# Patient Record
Sex: Female | Born: 1975 | Race: White | Hispanic: No | Marital: Married | Smoking: Former smoker
Health system: Southern US, Community
[De-identification: ages and names within clinical notes are randomized; demographics above are authoritative.]

## PROBLEM LIST (undated history)

## (undated) DIAGNOSIS — N200 Calculus of kidney: Secondary | ICD-10-CM

## (undated) DIAGNOSIS — R7303 Prediabetes: Secondary | ICD-10-CM

## (undated) DIAGNOSIS — N6012 Diffuse cystic mastopathy of left breast: Secondary | ICD-10-CM

## (undated) DIAGNOSIS — Z87442 Personal history of urinary calculi: Secondary | ICD-10-CM

## (undated) HISTORY — DX: Calculus of kidney: N20.0

## (undated) HISTORY — DX: Diffuse cystic mastopathy of left breast: N60.12

---

## 1999-09-24 ENCOUNTER — Emergency Department (HOSPITAL_COMMUNITY): Admission: EM | Admit: 1999-09-24 | Discharge: 1999-09-24 | Payer: Self-pay | Admitting: Emergency Medicine

## 1999-09-25 ENCOUNTER — Encounter: Payer: Self-pay | Admitting: Emergency Medicine

## 2013-04-18 ENCOUNTER — Emergency Department: Payer: Self-pay | Admitting: Emergency Medicine

## 2015-05-19 ENCOUNTER — Emergency Department
Admission: EM | Admit: 2015-05-19 | Discharge: 2015-05-19 | Disposition: A | Discharge status: Home / Self Care | Payer: Managed Care, Other (non HMO) | Attending: Emergency Medicine | Admitting: Emergency Medicine

## 2015-05-19 ENCOUNTER — Emergency Department: Payer: Managed Care, Other (non HMO)

## 2015-05-19 ENCOUNTER — Encounter: Payer: Self-pay | Admitting: Emergency Medicine

## 2015-05-19 DIAGNOSIS — Z3202 Encounter for pregnancy test, result negative: Secondary | ICD-10-CM | POA: Diagnosis not present

## 2015-05-19 DIAGNOSIS — R109 Unspecified abdominal pain: Secondary | ICD-10-CM

## 2015-05-19 DIAGNOSIS — R1032 Left lower quadrant pain: Secondary | ICD-10-CM | POA: Diagnosis present

## 2015-05-19 DIAGNOSIS — N200 Calculus of kidney: Secondary | ICD-10-CM | POA: Diagnosis not present

## 2015-05-19 DIAGNOSIS — Z87891 Personal history of nicotine dependence: Secondary | ICD-10-CM | POA: Diagnosis not present

## 2015-05-19 LAB — POCT PREGNANCY, URINE: PREG TEST UR: NEGATIVE

## 2015-05-19 LAB — URINALYSIS COMPLETE WITH MICROSCOPIC (ARMC ONLY)
BILIRUBIN URINE: NEGATIVE
GLUCOSE, UA: NEGATIVE mg/dL
Nitrite: NEGATIVE
PH: 5 (ref 5.0–8.0)
Protein, ur: NEGATIVE mg/dL
Specific Gravity, Urine: 1.025 (ref 1.005–1.030)

## 2015-05-19 LAB — COMPREHENSIVE METABOLIC PANEL
ALBUMIN: 4.2 g/dL (ref 3.5–5.0)
ALK PHOS: 75 U/L (ref 38–126)
ALT: 19 U/L (ref 14–54)
AST: 24 U/L (ref 15–41)
Anion gap: 10 (ref 5–15)
BUN: 12 mg/dL (ref 6–20)
CALCIUM: 9.2 mg/dL (ref 8.9–10.3)
CO2: 21 mmol/L — AB (ref 22–32)
CREATININE: 0.82 mg/dL (ref 0.44–1.00)
Chloride: 107 mmol/L (ref 101–111)
GFR calc non Af Amer: >60 mL/min (ref >60)
GLUCOSE: 145 mg/dL — AB (ref 65–99)
Potassium: 4.4 mmol/L (ref 3.5–5.1)
SODIUM: 138 mmol/L (ref 135–145)
Total Bilirubin: 0.7 mg/dL (ref 0.3–1.2)
Total Protein: 7.5 g/dL (ref 6.5–8.1)

## 2015-05-19 LAB — CBC
HCT: 43.6 % (ref 35.0–47.0)
Hemoglobin: 14.5 g/dL (ref 12.0–16.0)
MCH: 30.1 pg (ref 26.0–34.0)
MCHC: 33.2 g/dL (ref 32.0–36.0)
MCV: 90.5 fL (ref 80.0–100.0)
PLATELETS: 315 10*3/uL (ref 150–440)
RBC: 4.82 MIL/uL (ref 3.80–5.20)
RDW: 13.5 % (ref 11.5–14.5)
WBC: 13.9 10*3/uL — ABNORMAL HIGH (ref 3.6–11.0)

## 2015-05-19 LAB — LIPASE, BLOOD: Lipase: 21 U/L (ref 11–51)

## 2015-05-19 MED ORDER — MORPHINE SULFATE (PF) 4 MG/ML IV SOLN
4.0000 mg | Freq: Once | INTRAVENOUS | Status: AC
  Administered 2015-05-19: 4 mg via INTRAVENOUS

## 2015-05-19 MED ORDER — ONDANSETRON HCL 4 MG/2ML IJ SOLN
4.0000 mg | Freq: Once | INTRAMUSCULAR | Status: AC
  Administered 2015-05-19: 4 mg via INTRAVENOUS

## 2015-05-19 MED ORDER — ONDANSETRON HCL 4 MG/2ML IJ SOLN
INTRAMUSCULAR | Status: AC
  Filled 2015-05-19: qty 2

## 2015-05-19 MED ORDER — ONDANSETRON HCL 4 MG PO TABS: 4.0000 mg | ORAL_TABLET | Freq: Three times a day (TID) | ORAL | Status: DC | PRN

## 2015-05-19 MED ORDER — FENTANYL CITRATE (PF) 100 MCG/2ML IJ SOLN
50.0000 ug | Freq: Once | INTRAMUSCULAR | Status: AC
  Administered 2015-05-19: 50 ug via INTRAVENOUS

## 2015-05-19 MED ORDER — KETOROLAC TROMETHAMINE 30 MG/ML IJ SOLN
30.0000 mg | Freq: Once | INTRAMUSCULAR | Status: AC
  Administered 2015-05-19: 30 mg via INTRAVENOUS
  Filled 2015-05-19: qty 1

## 2015-05-19 MED ORDER — ONDANSETRON HCL 4 MG/2ML IJ SOLN
INTRAMUSCULAR | Status: AC
  Administered 2015-05-19: 4 mg via INTRAVENOUS
  Filled 2015-05-19: qty 2

## 2015-05-19 MED ORDER — TAMSULOSIN HCL 0.4 MG PO CAPS
0.4000 mg | ORAL_CAPSULE | Freq: Once | ORAL | Status: AC
  Administered 2015-05-19: 0.4 mg via ORAL
  Filled 2015-05-19: qty 1

## 2015-05-19 MED ORDER — MORPHINE SULFATE (PF) 4 MG/ML IV SOLN
INTRAVENOUS | Status: AC
  Filled 2015-05-19: qty 1

## 2015-05-19 MED ORDER — FENTANYL CITRATE (PF) 100 MCG/2ML IJ SOLN
INTRAMUSCULAR | Status: AC
  Administered 2015-05-19: 50 ug via INTRAVENOUS
  Filled 2015-05-19: qty 2

## 2015-05-19 MED ORDER — KETOROLAC TROMETHAMINE 10 MG PO TABS: 10.0000 mg | ORAL_TABLET | Freq: Three times a day (TID) | ORAL | Status: DC | PRN

## 2015-05-19 MED ORDER — TAMSULOSIN HCL 0.4 MG PO CAPS: 0.4000 mg | ORAL_CAPSULE | Freq: Every day | ORAL | Status: DC

## 2015-05-19 NOTE — ED Provider Notes (Signed)
[facility] Emergency Department Provider Note    ____________________________________________  Time seen: 7  I have reviewed the triage vital signs and the nursing notes.   HISTORY  Chief Complaint Abdominal Pain   History limited by: Not Limited   HPI Kristi Ibarra is a 40 y.o. female who presents to the emergency department today because of concerns for abdominal pain. She states that it started roughly 3 hours ago. It is located in the left lower quadrant. It does radiate to her back. She describes it as sharp and severe. It does wax and wane. She has not been able to identify any modifying factors. She has never had pain like this before. She has not noticed any recent change to urination or defecation. She denies any fevers.     History reviewed. No pertinent past medical history.  There are no active problems to display for this patient.   History reviewed. No pertinent past surgical history.   Allergies Erythromycin  History reviewed. No pertinent family history.  Social History Social History  Substance Use Topics  . Smoking status: Former Games developer  . Smokeless tobacco: None  . Alcohol Use: No    Review of Systems  Constitutional: Negative for fever. Cardiovascular: Negative for chest pain. Respiratory: Negative for shortness of breath. Gastrointestinal: Negative for abdominal pain, vomiting and diarrhea. Neurological: Negative for headaches, focal weakness or numbness.  10-point ROS otherwise negative.  ____________________________________________   PHYSICAL EXAM:  VITAL SIGNS: ED Triage Vitals  Enc Vitals Group     BP 05/19/15 0107 108/79 mmHg     Pulse Rate 05/19/15 0107 85     Resp --      Temp 05/19/15 0109 97.9 F (36.6 C)     Temp Source 05/19/15 0109 Oral     SpO2 05/19/15 0107 98 %     Weight --      Height --      Head Cir --      Peak Flow --      Pain Score 05/19/15 0120 10   Constitutional: Alert  and oriented. Appears in moderate distress  Eyes: Conjunctivae are normal. PERRL. Normal extraocular movements. ENT   Head: Normocephalic and atraumatic.   Nose: No congestion/rhinnorhea.   Mouth/Throat: Mucous membranes are moist.   Neck: No stridor. Hematological/Lymphatic/Immunilogical: No cervical lymphadenopathy. Cardiovascular: Normal rate, regular rhythm.  No murmurs, rubs, or gallops. Respiratory: Normal respiratory effort without tachypnea nor retractions. Breath sounds are clear and equal bilaterally. No wheezes/rales/rhonchi. Gastrointestinal: Soft and minimally tender to palpation in the left lower quadrant. Genitourinary: Deferred Musculoskeletal: Normal range of motion in all extremities. No joint effusions.  No lower extremity tenderness nor edema. Neurologic:  Normal speech and language. No gross focal neurologic deficits are appreciated.  Skin:  Skin is warm, dry and intact. No rash noted. Psychiatric: Mood and affect are normal. Speech and behavior are normal. Patient exhibits appropriate insight and judgment.  ____________________________________________    LABS (pertinent positives/negatives)  Labs Reviewed  COMPREHENSIVE METABOLIC PANEL - Abnormal; Notable for the following:    CO2 21 (*)    Glucose, Bld 145 (*)    All other components within normal limits  CBC - Abnormal; Notable for the following:    WBC 13.9 (*)    All other components within normal limits  URINALYSIS COMPLETEWITH MICROSCOPIC ([facility] ONLY) - Abnormal; Notable for the following:    Color, Urine YELLOW (*)    APPearance HAZY (*)    Ketones, ur 1+ (*)  Hgb urine dipstick 2+ (*)    Leukocytes, UA TRACE (*)    Bacteria, UA RARE (*)    Squamous Epithelial / LPF 6-30 (*)    All other components within normal limits  LIPASE, BLOOD  POC URINE PREG, ED  POCT PREGNANCY, URINE      ____________________________________________   EKG  None  ____________________________________________    RADIOLOGY  CT renal IMPRESSION: A 3 mm left UVJ stone with mild left hydronephrosis.  Small nonobstructing left renal interpolar calculi.  Colonic diverticulosis. No evidence of bowel obstruction or inflammation. Normal appendix.   ____________________________________________   PROCEDURES  Procedure(s) performed: None  Critical Care performed: No  ____________________________________________   INITIAL IMPRESSION / ASSESSMENT AND PLAN / ED COURSE  Pertinent labs & imaging results that were available during my care of the patient were reviewed by me and considered in my medical decision making (see chart for details).  Patient presented to the emergency department today because of concerns for left flank pain. History is concerning for a kidney stone. A renal stone CT was obtained which did show a 3 mm left UVJ stone. Patient felt much that her after Toradol. Will discharge home with medications and urology follow-up.  ____________________________________________   FINAL CLINICAL IMPRESSION(S) / ED DIAGNOSES  Final diagnoses:  Left flank pain  Kidney stone     Phineas Semen, MD 05/19/15 1610

## 2015-05-19 NOTE — ED Notes (Signed)
Patient reports left lower quad abdominal pain that radiates towards left lower back.

## 2015-05-19 NOTE — Discharge Instructions (Signed)
Please seek medical attention for any high fevers, chest pain, shortness of breath, change in behavior, persistent vomiting, bloody stool or any other new or concerning symptoms. ° ° °Kidney Stones °Kidney stones (urolithiasis) are deposits that form inside your kidneys. The intense pain is caused by the stone moving through the urinary tract. When the stone moves, the ureter goes into spasm around the stone. The stone is usually passed in the urine.  °CAUSES  °· A disorder that makes certain neck glands produce too much parathyroid hormone (primary hyperparathyroidism). °· A buildup of uric acid crystals, similar to gout in your joints. °· Narrowing (stricture) of the ureter. °· A kidney obstruction present at birth (congenital obstruction). °· Previous surgery on the kidney or ureters. °· Numerous kidney infections. °SYMPTOMS  °· Feeling sick to your stomach (nauseous). °· Throwing up (vomiting). °· Blood in the urine (hematuria). °· Pain that usually spreads (radiates) to the groin. °· Frequency or urgency of urination. °DIAGNOSIS  °· Taking a history and physical exam. °· Blood or urine tests. °· CT scan. °· Occasionally, an examination of the inside of the urinary bladder (cystoscopy) is performed. °TREATMENT  °· Observation. °· Increasing your fluid intake. °· Extracorporeal shock wave lithotripsy--This is a noninvasive procedure that uses shock waves to break up kidney stones. °· Surgery may be needed if you have severe pain or persistent obstruction. There are various surgical procedures. Most of the procedures are performed with the use of small instruments. Only small incisions are needed to accommodate these instruments, so recovery time is minimized. °The size, location, and chemical composition are all important variables that will determine the proper choice of action for you. Talk to your health care provider to better understand your situation so that you will minimize the risk of injury to yourself  and your kidney.  °HOME CARE INSTRUCTIONS  °· Drink enough water and fluids to keep your urine clear or pale yellow. This will help you to pass the stone or stone fragments. °· Strain all urine through the provided strainer. Keep all particulate matter and stones for your health care provider to see. The stone causing the pain may be as small as a grain of salt. It is very important to use the strainer each and every time you pass your urine. The collection of your stone will allow your health care provider to analyze it and verify that a stone has actually passed. The stone analysis will often identify what you can do to reduce the incidence of recurrences. °· Only take over-the-counter or prescription medicines for pain, discomfort, or fever as directed by your health care provider. °· Keep all follow-up visits as told by your health care provider. This is important. °· Get follow-up X-rays if required. The absence of pain does not always mean that the stone has passed. It may have only stopped moving. If the urine remains completely obstructed, it can cause loss of kidney function or even complete destruction of the kidney. It is your responsibility to make sure X-rays and follow-ups are completed. Ultrasounds of the kidney can show blockages and the status of the kidney. Ultrasounds are not associated with any radiation and can be performed easily in a matter of minutes. °· Make changes to your daily diet as told by your health care provider. You may be told to: °¨ Limit the amount of salt that you eat. °¨ Eat 5 or more servings of fruits and vegetables each day. °¨ Limit the amount of meat,   poultry, fish, and eggs that you eat. °· Collect a 24-hour urine sample as told by your health care provider. You may need to collect another urine sample every 6-12 months. °SEEK MEDICAL CARE IF: °· You experience pain that is progressive and unresponsive to any pain medicine you have been prescribed. °SEEK IMMEDIATE  MEDICAL CARE IF:  °· Pain cannot be controlled with the prescribed medicine. °· You have a fever or shaking chills. °· The severity or intensity of pain increases over 18 hours and is not relieved by pain medicine. °· You develop a new onset of abdominal pain. °· You feel faint or pass out. °· You are unable to urinate. °  °This information is not intended to replace advice given to you by your health care provider. Make sure you discuss any questions you have with your health care provider. °  °Document Released: 04/13/2005 Document Revised: 01/02/2015 Document Reviewed: 09/14/2012 °Elsevier Interactive Patient Education ©2016 [employer] ° °

## 2016-01-30 ENCOUNTER — Encounter: Payer: Self-pay | Admitting: Obstetrics and Gynecology

## 2016-01-30 ENCOUNTER — Ambulatory Visit (INDEPENDENT_AMBULATORY_CARE_PROVIDER_SITE_OTHER): Payer: Managed Care, Other (non HMO) | Admitting: Obstetrics and Gynecology

## 2016-01-30 VITALS — BP 112/78 | HR 82 | Ht 63.5 in | Wt 173.8 lb

## 2016-01-30 DIAGNOSIS — N938 Other specified abnormal uterine and vaginal bleeding: Secondary | ICD-10-CM

## 2016-01-30 DIAGNOSIS — N951 Menopausal and female climacteric states: Secondary | ICD-10-CM | POA: Diagnosis not present

## 2016-01-30 DIAGNOSIS — Z124 Encounter for screening for malignant neoplasm of cervix: Secondary | ICD-10-CM

## 2016-01-30 NOTE — Progress Notes (Signed)
GYNECOLOGY CLINIC PROGRESS NOTE  Subjective:     Kristi Ibarra is an 40 y.o. G0P0  woman who presents for irregular menses. She had been bleeding regularly up until 4 months ago.  In June patient notes hr menstrual cycle lasted only 3 days.  She skipped menses in July, and then in August her cycle lasted 24 days (with onset 12/23/15).  Notes that she had 3-4 days of resolution, and then menses started again on September 27th, and is currently still ongoing.  She changes her pad or tampon every 2-3 hours. Clots are small. Dysmenorrhea:noted as mostly upper back pain during first 2-3 days of cycle. Current contraception: none. History of infertility: yes (patient notes she has not used contraception in many years but was never able to conceive). History of abnormal Pap smear: no.  Patient has not had a pap smear or gynecologic visit in ~ 6-7 years.  Menstrual History: OB History    Gravida Para Term Preterm AB Living   0             SAB TAB Ectopic Multiple Live Births                  Menarche age: 50 Patient's last menstrual period was 10/09/2015 (exact date).     History reviewed. No pertinent past medical history.  Family History  Problem Relation Age of Onset  . Asthma Mother     History reviewed. No pertinent surgical history.  Social History   Social History  . Marital status: Married    Spouse name: N/A  . Number of children: N/A  . Years of education: N/A   Occupational History  . Not on file.   Social History Main Topics  . Smoking status: Current Every Day Smoker  . Smokeless tobacco: Never Used  . Alcohol use No  . Drug use: No  . Sexual activity: Yes    Partners: Male   Other Topics Concern  . Not on file   Social History Narrative  . No narrative on file    Current Outpatient Prescriptions on File Prior to Visit  Medication Sig Dispense Refill  . ketorolac (TORADOL) 10 MG tablet Take 1 tablet (10 mg total) by mouth every 8 (eight) hours as needed  for severe pain. 20 tablet 0  . ondansetron (ZOFRAN) 4 MG tablet Take 1 tablet (4 mg total) by mouth every 8 (eight) hours as needed for nausea or vomiting. 20 tablet 0  . tamsulosin (FLOMAX) 0.4 MG CAPS capsule Take 1 capsule (0.4 mg total) by mouth daily. 14 capsule 0   No current facility-administered medications on file prior to visit.     Allergies  Allergen Reactions  . Erythromycin Rash     Review of Systems A comprehensive review of systems was negative except for: Genitourinary: positive for abnormal menstrual periods and hot flashes    Objective:    BP 112/78   Pulse 82   Ht 5' 3.5" (1.613 m)   Wt 173 lb 12.8 oz (78.8 kg)   LMP 10/09/2015 (Exact Date) Comment: irregular menses  BMI 30.30 kg/m   General:   alert and no distress  Skin:    normal and no rash or abnormalities  Neck:  no adenopathy, no carotid bruit, no JVD, supple, symmetrical, trachea midline and thyroid not enlarged, symmetric, no tenderness/mass/nodules  Heart:   regular rate and rhythm, S1, S2 normal, no murmur, click, rub or gallop   Lungs:   clear  to auscultation bilaterally  Abdomen:  soft, non-tender; bowel sounds normal; no masses,  no organomegaly  Pelvic:   external genitalia normal, rectovaginal septum normal.  Vagina without discharge.  Cervix normal appearing, no lesions and no motion tenderness.  Uterus mobile, nontender, normal shape and size.  Adnexae non-palpable, nontender bilaterally.   Extremities:   extremities normal, atraumatic, no cyanosis or edema      Assessment:    dysfunctional uterine bleeding   Hot flushes Cervical cancer screening  Plan:   - Patient has abnormal uterine bleeding . She has a normal exam, no evidence of lesions.  - Blood tests: CBC with diff, Estradiol, FSH, LH, Progesterone level and TSH. Will assess for anemia due to bleeding, and assess for perimenopausal status.  - Pap smear performed today. - Pelvic ultrasound to evaluate for any structural  gynecologic abnormalities.    - Patient will f/u in 1-2 weeks to discuss results and plans for further evaluation/management. Discussed likelihood of needing endometrial biopsy at next visit.  - Hot flushes, may be secondary to hormonal dysregulation, possibly perimenopausal.  Can discuss management options after workup of DUB.   Hildred LaserAnika Toussaint Golson, MD [facility]

## 2016-01-31 LAB — TSH: TSH: 1.85 u[IU]/mL (ref 0.450–4.500)

## 2016-01-31 LAB — CBC
Hematocrit: 44.9 % (ref 34.0–46.6)
Hemoglobin: 15.2 g/dL (ref 11.1–15.9)
MCH: 31.1 pg (ref 26.6–33.0)
MCHC: 33.9 g/dL (ref 31.5–35.7)
MCV: 92 fL (ref 79–97)
PLATELETS: 301 10*3/uL (ref 150–379)
RBC: 4.88 x10E6/uL (ref 3.77–5.28)
RDW: 13.6 % (ref 12.3–15.4)
WBC: 9.3 10*3/uL (ref 3.4–10.8)

## 2016-01-31 LAB — PROGESTERONE: Progesterone: 0.1 ng/mL

## 2016-01-31 LAB — ESTRADIOL: Estradiol: 132.8 pg/mL

## 2016-01-31 LAB — FSH/LH
FSH: 3.8 m[IU]/mL
LH: 2.9 m[IU]/mL

## 2016-02-06 ENCOUNTER — Encounter: Payer: Self-pay | Admitting: Obstetrics and Gynecology

## 2016-02-06 ENCOUNTER — Ambulatory Visit (INDEPENDENT_AMBULATORY_CARE_PROVIDER_SITE_OTHER): Payer: Managed Care, Other (non HMO)

## 2016-02-06 ENCOUNTER — Ambulatory Visit (INDEPENDENT_AMBULATORY_CARE_PROVIDER_SITE_OTHER): Payer: Managed Care, Other (non HMO) | Admitting: Obstetrics and Gynecology

## 2016-02-06 VITALS — BP 117/79 | HR 83 | Ht 63.5 in | Wt 171.3 lb

## 2016-02-06 DIAGNOSIS — N938 Other specified abnormal uterine and vaginal bleeding: Secondary | ICD-10-CM

## 2016-02-06 DIAGNOSIS — N83201 Unspecified ovarian cyst, right side: Secondary | ICD-10-CM | POA: Diagnosis not present

## 2016-02-06 LAB — PAP IG AND HPV HIGH-RISK
HPV, HIGH-RISK: NEGATIVE
PAP SMEAR COMMENT: 0

## 2016-02-06 MED ORDER — MEDROXYPROGESTERONE ACETATE 10 MG PO TABS
10.0000 mg | ORAL_TABLET | Freq: Every day | ORAL | 2 refills | Status: DC
Start: 2016-02-06 — End: 2016-02-19

## 2016-02-06 NOTE — Progress Notes (Signed)
GYNECOLOGY PROGRESS NOTE  Subjective:    Patient ID: Kristi Ibarra, female    DOB: 11/02/1975, 40 y.o.   MRN: [id number]  HPI  Patient is a 40 y.o. G0P0 female who presents for f/u after labs and ultrasound for DUB. Notes that menses is still ongoing, now into week 2.   The following portions of the patient's history were reviewed and updated as appropriate: allergies, current medications, past family history, past medical history, past social history, past surgical history and problem list.  Review of Systems Pertinent items noted in HPI and remainder of comprehensive ROS otherwise negative.   Objective:   Blood pressure 117/79, pulse 83, height 5' 3.5" (1.613 m), weight 171 lb 4.8 oz (77.7 kg), last menstrual period 02/06/2016. General appearance: alert and no distress Abdomen: soft, non-tender; bowel sounds normal; no masses,  no organomegaly Pelvic: external genitalia normal, rectovaginal septum normal.  Vagina with moderate amount of dark red blood with small clots in vault. Cervix normal appearing, no lesions and no motion tenderness.  Uterus mobile, nontender, normal shape and size.  Adnexae non-palpable, nontender bilaterally.  Extremities: extremities normal, atraumatic, no cyanosis or edema Neurologic: Grossly normal    Labs:  Results for orders placed or performed in visit on 01/30/16  CBC  Result Value Ref Range   WBC 9.3 3.4 - 10.8 x10E3/uL   RBC 4.88 3.77 - 5.28 x10E6/uL   Hemoglobin 15.2 11.1 - 15.9 g/dL   Hematocrit 29.544.9 62.134.0 - 46.6 %   MCV 92 79 - 97 fL   MCH 31.1 26.6 - 33.0 pg   MCHC 33.9 31.5 - 35.7 g/dL   RDW 30.813.6 65.712.3 - 84.615.4 %   Platelets 301 150 - 379 x10E3/uL  TSH  Result Value Ref Range   TSH 1.850 0.450 - 4.500 uIU/mL  FSH/LH  Result Value Ref Range   LH 2.9 mIU/mL   FSH 3.8 mIU/mL  Estradiol  Result Value Ref Range   Estradiol 132.8 pg/mL  Progesterone  Result Value Ref Range   Progesterone 0.1 ng/mL  Pap IG and HPV (high risk) DNA  detection  Result Value Ref Range   Test Ordered: WILL FOLLOW    DIAGNOSIS: WILL FOLLOW    Recommendation: WILL FOLLOW    Specimen adequacy: WILL FOLLOW    Stain Source WILL FOLLOW    Clinical history: WILL FOLLOW    Amended report: WILL FOLLOW    Additional comment: WILL FOLLOW    Addendum: WILL FOLLOW    Maturation index: WILL FOLLOW    Performed by: WILL FOLLOW    QC reviewed by: WILL FOLLOW    Diagnosis provided by: WILL FOLLOW    Electronically signed by: WILL FOLLOW    Special procedure: WILL FOLLOW    QA comment: WILL FOLLOW    PAP SMEAR COMMENT WILL FOLLOW    PAP NOTE: WILL FOLLOW    Note: WILL FOLLOW    HPV, high-risk Negative Negative    Imaging:  ULTRASOUND REPORT  Location: [facility] Date of Service: 02/06/16   Indications:AUB Findings:  The uterus measures 8 x 3.5 x 3.9 cm. Echo texture is homogenous without evidence of focal masses.  The Endometrium measures 14.7 mm.  Endometrium is thickened and inhomogeneous.  Right Ovary measures 2 x 1.3 x 1.6 cm. It is normal in appearance. Left Ovary measures 3.3 x 2 x 3  cm. There is a simple cyst on the left ovary measuring 2.8 x 1.6 x 2.4 cm. Survey  of the adnexa demonstrates no adnexal masses. There is no free fluid in the cul de sac.  Impression: 1. Endometrium is thickened and inhomogeneous. 2. Simple right ovarian cyst.  Recommendations: 1.Clinical correlation with the patient's History and Physical Exam.   Assessment:   DUB Simple right ovarian cyst  Plan:   - Discussed ultrasound findings and labs with patient.  Progesterone on low side of normal.  Patient would likely benefit from treatment regimen with progesterone. Endometrial biopsy performed today.  Can treat with Provera tablets.  To call back in 1 week for results, and f/u pap smear results as well. To discuss further options for treatment.  Patient currently still noting that she desires fertility and may like to  proceed with further workup once bleeding issues have resolved.  May need to continue on Provera tablets for extended period of time if this is the case.  Also discussed that based on age, she may also require assistance from fertility specialist.  - Simple right ovarian cyst, ~ 3 cm.  Discussed that cyst would likely resolve without intervention.  Patient currently without major symptoms.    Endometrial Biopsy Procedure Note  The patient is positioned on the exam table in the dorsal lithotomy position. Bimanual exam confirms uterine position and size. A Graves speculum is placed into the vagina. A single toothed tenaculum is placed onto the anterior lip of the cervix. The pipette is placed into the endocervical canal and is advanced to the uterine fundus. Using a piston like technique, with vacuum created by withdrawing the stylus, the endometrial specimen is obtained and transferred to the biopsy container. Minimal bleeding is encountered. The procedure is well tolerated.   Uterine Position: mid    Uterine Length:  9 cm   Uterine Specimen: Average   Post procedure instructions are given. The patient is scheduled for follow up appointment.    A total of 15 minutes were spent face-to-face with the patient during this encounter and over half of that time dealt with counseling and coordination of care.   Hildred Laser, MD [facility]

## 2016-02-06 NOTE — Patient Instructions (Signed)

## 2016-02-07 NOTE — Addendum Note (Signed)
Addended by: Frankey ShownGLANTON, Bhavya Eschete C on: 02/07/2016 02:02 PM   Modules accepted: Orders

## 2016-02-11 LAB — PATHOLOGY

## 2016-02-14 ENCOUNTER — Encounter: Payer: Self-pay | Admitting: Obstetrics and Gynecology

## 2016-02-17 ENCOUNTER — Encounter: Payer: Self-pay | Admitting: Obstetrics and Gynecology

## 2016-02-19 ENCOUNTER — Other Ambulatory Visit: Payer: Self-pay | Admitting: Obstetrics and Gynecology

## 2016-02-19 MED ORDER — NORETHIN ACE-ETH ESTRAD-FE 1-20 MG-MCG PO TABS: 1.0000 | ORAL_TABLET | Freq: Every day | ORAL | 11 refills | Status: DC

## 2016-04-07 ENCOUNTER — Other Ambulatory Visit: Payer: Self-pay

## 2016-04-07 ENCOUNTER — Encounter: Payer: Self-pay | Admitting: Obstetrics and Gynecology

## 2016-04-07 DIAGNOSIS — Z7689 Persons encountering health services in other specified circumstances: Secondary | ICD-10-CM

## 2017-08-18 ENCOUNTER — Encounter: Payer: Self-pay | Admitting: Obstetrics and Gynecology

## 2017-08-18 ENCOUNTER — Ambulatory Visit (INDEPENDENT_AMBULATORY_CARE_PROVIDER_SITE_OTHER): Payer: Managed Care, Other (non HMO) | Admitting: Obstetrics and Gynecology

## 2017-08-18 VITALS — BP 119/84 | HR 76 | Ht 63.5 in | Wt 172.8 lb

## 2017-08-18 DIAGNOSIS — Z1231 Encounter for screening mammogram for malignant neoplasm of breast: Secondary | ICD-10-CM | POA: Diagnosis not present

## 2017-08-18 DIAGNOSIS — N938 Other specified abnormal uterine and vaginal bleeding: Secondary | ICD-10-CM

## 2017-08-18 DIAGNOSIS — R194 Change in bowel habit: Secondary | ICD-10-CM | POA: Diagnosis not present

## 2017-08-18 DIAGNOSIS — Z01419 Encounter for gynecological examination (general) (routine) without abnormal findings: Secondary | ICD-10-CM

## 2017-08-18 DIAGNOSIS — Z13220 Encounter for screening for lipoid disorders: Secondary | ICD-10-CM | POA: Diagnosis not present

## 2017-08-18 MED ORDER — NORETHINDRONE ACETATE 5 MG PO TABS: 5.0000 mg | ORAL_TABLET | Freq: Every day | ORAL | 2 refills | Status: DC

## 2017-08-18 NOTE — Progress Notes (Signed)
Pt is having irregular, heavy and light cycles that last for weeks. No itching, burning, or discharge. Pt was asked about mammogram screening pt have never had a mammogram.

## 2017-08-18 NOTE — Progress Notes (Signed)
GYNECOLOGY ANNUAL PHYSICAL EXAM PROGRESS NOTE  Subjective:    Kristi Ibarra is a 42 y.o. G0P0 female who presents for an annual exam.  The patient is sexually active. The patient wears seatbelts: yes. The patient participates in regular exercise: no. Has the patient ever been transfused or tattooed?: no. The patient reports that there is not domestic violence in her life.   The patient has the following complaints today:  1. The patient notes that her menstrual cycles have been irregular over the past 3-4 months.  In January, she had 2 cycles that month, but both were light to moderate.  She did not have a cycle in February, but then she had a cycle in March that was very heavy with passage of clots.  Of note, patient had a period of irregular cycles in 2017 and was prescribed OCPs at that time after a negative workup, but patient notes she never started them. States that her periods became normal again through all of 2018 and now are becoming irregular again. Notes some dysmenorrhea (not severe though).    Gynecologic History No LMP recorded. (Menstrual status: Irregular Periods). Menarche age: 55 Contraception: condoms History of STI's: Denies Last Pap: 02/03/2016. Results were: normal.  Denies h/o abnormal pap smears. Last mammogram: Patient has never had a mammogram.   OB History  Gravida Para Term Preterm AB Living  0 0 0 0 0 0  SAB TAB Ectopic Multiple Live Births  0 0 0 0 0    Past Medical History:  Diagnosis Date  . Kidney stones     History reviewed. No pertinent surgical history.    Family History  Problem Relation Age of Onset  . Asthma Mother   . Stroke Mother     Social History   Socioeconomic History  . Marital status: Married    Spouse name: Not on file  . Number of children: Not on file  . Years of education: Not on file  . Highest education level: Not on file  Occupational History  . Not on file  Social Needs  . Financial resource strain: Not on  file  . Food insecurity:    Worry: Not on file    Inability: Not on file  . Transportation needs:    Medical: Not on file    Non-medical: Not on file  Tobacco Use  . Smoking status: Current Every Day Smoker    Packs/day: 0.50  . Smokeless tobacco: Never Used  Substance and Sexual Activity  . Alcohol use: No  . Drug use: No  . Sexual activity: Yes    Partners: Male    Birth control/protection: None  Lifestyle  . Physical activity:    Days per week: Not on file    Minutes per session: Not on file  . Stress: Not on file  Relationships  . Social connections:    Talks on phone: Not on file    Gets together: Not on file    Attends religious service: Not on file    Active member of club or organization: Not on file    Attends meetings of clubs or organizations: Not on file    Relationship status: Not on file  . Intimate partner violence:    Fear of current or ex partner: Not on file    Emotionally abused: Not on file    Physically abused: Not on file    Forced sexual activity: Not on file  Other Topics Concern  . Not on  file  Social History Narrative  . Not on file    Current Outpatient Medications on File Prior to Visit  Medication Sig Dispense Refill  . ketorolac (TORADOL) 10 MG tablet Take 1 tablet (10 mg total) by mouth every 8 (eight) hours as needed for severe pain. 20 tablet 0  . norethindrone-ethinyl estradiol (JUNEL FE 1/20) 1-20 MG-MCG tablet Take 1 tablet by mouth daily. 1 Package 11  . ondansetron (ZOFRAN) 4 MG tablet Take 1 tablet (4 mg total) by mouth every 8 (eight) hours as needed for nausea or vomiting. 20 tablet 0  . tamsulosin (FLOMAX) 0.4 MG CAPS capsule Take 1 capsule (0.4 mg total) by mouth daily. (Patient not taking: Reported on 02/06/2016) 14 capsule 0   No current facility-administered medications on file prior to visit.     Allergies  Allergen Reactions  . Erythromycin Rash     Review of Systems Constitutional: negative for chills, fatigue,  fevers and sweats Eyes: negative for irritation, redness and visual disturbance Ears, nose, mouth, throat, and face: negative for hearing loss, nasal congestion, snoring and tinnitus Respiratory: negative for asthma, cough, sputum Cardiovascular: negative for chest pain, dyspnea, exertional chest pressure/discomfort, irregular heart beat, palpitations and syncope Gastrointestinal: negative for abdominal pain, nausea and vomiting.  Positive for in bowel habits (reports constipation alternating with loose stools, has been ongoing for several months).  Genitourinary: positive for abnormal menstrual periods (see HPI). No genital lesions, sexual problems and vaginal discharge, dysuria and urinary incontinence Integument/breast: negative for breast lump, breast tenderness and nipple discharge Hematologic/lymphatic: negative for bleeding and easy bruising Musculoskeletal:negative for back pain and muscle weakness Neurological: negative for dizziness, headaches, vertigo and weakness Endocrine: negative for diabetic symptoms including polydipsia, polyuria and skin dryness Allergic/Immunologic: negative for hay fever and urticaria        Objective:  Blood pressure 119/84, pulse 76, height 5' 3.5" (1.613 m), weight 172 lb 12.8 oz (78.4 kg). Body mass index is 30.13 kg/m.  General Appearance:    Alert, cooperative, no distress, appears stated age  Head:    Normocephalic, without obvious abnormality, atraumatic  Eyes:    PERRL, conjunctiva/corneas clear, EOM's intact, both eyes  Ears:    Normal external ear canals, both ears  Nose:   Nares normal, septum midline, mucosa normal, no drainage or sinus tenderness  Throat:   Lips, mucosa, and tongue normal; teeth and gums normal  Neck:   Supple, symmetrical, trachea midline, no adenopathy; thyroid: no enlargement/tenderness/nodules; no carotid bruit or JVD  Back:     Symmetric, no curvature, ROM normal, no CVA tenderness  Lungs:     Clear to auscultation  bilaterally, respirations unlabored  Chest Wall:    No tenderness or deformity   Heart:    Regular rate and rhythm, S1 and S2 normal, no murmur, rub or gallop  Breast Exam:    No tenderness, masses, or nipple abnormality  Abdomen:     Soft, non-tender, bowel sounds active all four quadrants, no masses, no organomegaly.    Genitalia:    Pelvic:external genitalia normal, vagina without lesions, discharge, or tenderness, rectovaginal septum  normal. Cervix normal in appearance, no cervical motion tenderness, no adnexal masses or tenderness.  Uterus feels enlarged, 12-14 week size, normal shape, mobile, regular contours, nontender.  Rectal:    Normal external sphincter.  No hemorrhoids appreciated. Internal exam not done.   Extremities:   Extremities normal, atraumatic, no cyanosis or edema  Pulses:   2+ and symmetric all extremities  Skin:   Skin color, texture, turgor normal, no rashes or lesions  Lymph nodes:   Cervical, supraclavicular, and axillary nodes normal  Neurologic:   CNII-XII intact, normal strength, sensation and reflexes throughout   .  Labs:  Lab Results  Component Value Date   WBC 9.3 01/30/2016   HGB 15.2 01/30/2016   HCT 44.9 01/30/2016   MCV 92 01/30/2016   PLT 301 01/30/2016    Lab Results  Component Value Date   CREATININE 0.82 05/19/2015   BUN 12 05/19/2015   NA 138 05/19/2015   K 4.4 05/19/2015   CL 107 05/19/2015   CO2 21 (L) 05/19/2015    Lab Results  Component Value Date   ALT 19 05/19/2015   AST 24 05/19/2015   ALKPHOS 75 05/19/2015   BILITOT 0.7 05/19/2015    Lab Results  Component Value Date   TSH 1.850 01/30/2016     Assessment:     Routine gynecologic exam.    Dysfunctional uterine bleeding   Bowel habit changes    Plan:     Blood tests: CBC with diff, Comprehensive metabolic panel, TSH and Lipoproteins. Breast self exam technique reviewed and patient encouraged to perform self-exam monthly. Contraception: condoms. Discussed  healthy lifestyle modifications. Mammogram ordered Pap smear up to date.   Will order pelvic ultrasound to assess uterine enlargement and DUB. Discussed management options for DUB, patient ultimately desires hysterectomy, but has not attempted any other methods for management.  Discussed that insurance usually requires a trial of medical management prior to surgical management.  Discussed progesterone-only methods such as Aygestin, Camilla (OCP), Depo Provera, Mirena IUD.  Patient will use Aygestin. Will f/u in  2 months for re-evaluation.  Bowel habit changes, may be symptoms of IBS. Will refer to GI for further evaluation.      Hildred Laserherry, Leul Narramore, MD [facility]

## 2017-08-18 NOTE — Patient Instructions (Signed)
Abnormal Uterine Bleeding Abnormal uterine bleeding can affect women at various stages in life, including teenagers, women in their reproductive years, pregnant women, and women who have reached menopause. Several kinds of uterine bleeding are considered abnormal, including:  Bleeding or spotting between periods.  Bleeding after sexual intercourse.  Bleeding that is heavier or more than normal.  Periods that last longer than usual.  Bleeding after menopause.  Many cases of abnormal uterine bleeding are minor and simple to treat, while others are more serious. Any type of abnormal bleeding should be evaluated by your health care provider. Treatment will depend on the cause of the bleeding. Follow these instructions at home: Monitor your condition for any changes. The following actions may help to alleviate any discomfort you are experiencing:  Avoid the use of tampons and douches as directed by your health care provider.  Change your pads frequently.  You should get regular pelvic exams and Pap tests. Keep all follow-up appointments for diagnostic tests as directed by your health care provider. Contact a health care provider if:  Your bleeding lasts more than 1 week.  You feel dizzy at times. Get help right away if:  You pass out.  You are changing pads every 15 to 30 minutes.  You have abdominal pain.  You have a fever.  You become sweaty or weak.  You are passing large blood clots from the vagina.  You start to feel nauseous and vomit. This information is not intended to replace advice given to you by your health care provider. Make sure you discuss any questions you have with your health care provider. Document Released: 04/13/2005 Document Revised: 09/25/2015 Document Reviewed: 11/10/2012 Elsevier Interactive Patient Education  2017 [place] Maintenance, Female Adopting a healthy lifestyle and getting preventive care can go a long way to promote health  and wellness. Talk with your health care provider about what schedule of regular examinations is right for you. This is a good chance for you to check in with your provider about disease prevention and staying healthy. In between checkups, there are plenty of things you can do on your own. Experts have done a lot of research about which lifestyle changes and preventive measures are most likely to keep you healthy. Ask your health care provider for more information. Weight and diet Eat a healthy diet  Be sure to include plenty of vegetables, fruits, low-fat dairy products, and lean protein.  Do not eat a lot of foods high in solid fats, added sugars, or salt.  Get regular exercise. This is one of the most important things you can do for your health. ? Most adults should exercise for at least 150 minutes each week. The exercise should increase your heart rate and make you sweat (moderate-intensity exercise). ? Most adults should also do strengthening exercises at least twice a week. This is in addition to the moderate-intensity exercise.  Maintain a healthy weight  Body mass index (BMI) is a measurement that can be used to identify possible weight problems. It estimates body fat based on height and weight. Your health care provider can help determine your BMI and help you achieve or maintain a healthy weight.  For females 28 years of age and older: ? A BMI below 18.5 is considered underweight. ? A BMI of 18.5 to 24.9 is normal. ? A BMI of 25 to 29.9 is considered overweight. ? A BMI of 30 and above is considered obese.  Watch levels of cholesterol and blood lipids  You should start having your blood tested for lipids and cholesterol at 43 years of age, then have this test every 5 years.  You may need to have your cholesterol levels checked more often if: ? Your lipid or cholesterol levels are high. ? You are older than 43 years of age. ? You are at high risk for heart disease.  Cancer  screening Lung Cancer  Lung cancer screening is recommended for adults 81-44 years old who are at high risk for lung cancer because of a history of smoking.  A yearly low-dose CT scan of the lungs is recommended for people who: ? Currently smoke. ? Have quit within the past 15 years. ? Have at least a 30-pack-year history of smoking. A pack year is smoking an average of one pack of cigarettes a day for 1 year.  Yearly screening should continue until it has been 15 years since you quit.  Yearly screening should stop if you develop a health problem that would prevent you from having lung cancer treatment.  Breast Cancer  Practice breast self-awareness. This means understanding how your breasts normally appear and feel.  It also means doing regular breast self-exams. Let your health care provider know about any changes, no matter how small.  If you are in your 20s or 30s, you should have a clinical breast exam (CBE) by a health care provider every 1-3 years as part of a regular health exam.  If you are 84 or older, have a CBE every year. Also consider having a breast X-ray (mammogram) every year.  If you have a family history of breast cancer, talk to your health care provider about genetic screening.  If you are at high risk for breast cancer, talk to your health care provider about having an MRI and a mammogram every year.  Breast cancer gene (BRCA) assessment is recommended for women who have family members with BRCA-related cancers. BRCA-related cancers include: ? Breast. ? Ovarian. ? Tubal. ? Peritoneal cancers.  Results of the assessment will determine the need for genetic counseling and BRCA1 and BRCA2 testing.  Cervical Cancer Your health care provider may recommend that you be screened regularly for cancer of the pelvic organs (ovaries, uterus, and vagina). This screening involves a pelvic examination, including checking for microscopic changes to the surface of your cervix  (Pap test). You may be encouraged to have this screening done every 3 years, beginning at age 22.  For women ages 43-65, health care providers may recommend pelvic exams and Pap testing every 3 years, or they may recommend the Pap and pelvic exam, combined with testing for human papilloma virus (HPV), every 5 years. Some types of HPV increase your risk of cervical cancer. Testing for HPV may also be done on women of any age with unclear Pap test results.  Other health care providers may not recommend any screening for nonpregnant women who are considered low risk for pelvic cancer and who do not have symptoms. Ask your health care provider if a screening pelvic exam is right for you.  If you have had past treatment for cervical cancer or a condition that could lead to cancer, you need Pap tests and screening for cancer for at least 20 years after your treatment. If Pap tests have been discontinued, your risk factors (such as having a new sexual partner) need to be reassessed to determine if screening should resume. Some women have medical problems that increase the chance of getting cervical cancer. In these  cases, your health care provider may recommend more frequent screening and Pap tests.  Colorectal Cancer  This type of cancer can be detected and often prevented.  Routine colorectal cancer screening usually begins at 43 years of age and continues through 43 years of age.  Your health care provider may recommend screening at an earlier age if you have risk factors for colon cancer.  Your health care provider may also recommend using home test kits to check for hidden blood in the stool.  A small camera at the end of a tube can be used to examine your colon directly (sigmoidoscopy or colonoscopy). This is done to check for the earliest forms of colorectal cancer.  Routine screening usually begins at age 84.  Direct examination of the colon should be repeated every 5-10 years through 43 years  of age. However, you may need to be screened more often if early forms of precancerous polyps or small growths are found.  Skin Cancer  Check your skin from head to toe regularly.  Tell your health care provider about any new moles or changes in moles, especially if there is a change in a mole's shape or color.  Also tell your health care provider if you have a mole that is larger than the size of a pencil eraser.  Always use sunscreen. Apply sunscreen liberally and repeatedly throughout the day.  Protect yourself by wearing long sleeves, pants, a wide-brimmed hat, and sunglasses whenever you are outside.  Heart disease, diabetes, and high blood pressure  High blood pressure causes heart disease and increases the risk of stroke. High blood pressure is more likely to develop in: ? People who have blood pressure in the high end of the normal range (130-139/85-89 mm Hg). ? People who are overweight or obese. ? People who are African American.  If you are 26-72 years of age, have your blood pressure checked every 3-5 years. If you are 66 years of age or older, have your blood pressure checked every year. You should have your blood pressure measured twice-once when you are at a hospital or clinic, and once when you are not at a hospital or clinic. Record the average of the two measurements. To check your blood pressure when you are not at a hospital or clinic, you can use: ? An automated blood pressure machine at a pharmacy. ? A home blood pressure monitor.  If you are between 60 years and 8 years old, ask your health care provider if you should take aspirin to prevent strokes.  Have regular diabetes screenings. This involves taking a blood sample to check your fasting blood sugar level. ? If you are at a normal weight and have a low risk for diabetes, have this test once every three years after 43 years of age. ? If you are overweight and have a high risk for diabetes, consider being tested  at a younger age or more often. Preventing infection Hepatitis B  If you have a higher risk for hepatitis B, you should be screened for this virus. You are considered at high risk for hepatitis B if: ? You were born in a country where hepatitis B is common. Ask your health care provider which countries are considered high risk. ? Your parents were born in a high-risk country, and you have not been immunized against hepatitis B (hepatitis B vaccine). ? You have HIV or AIDS. ? You use needles to inject street drugs. ? You live with someone who has  hepatitis B. ? You have had sex with someone who has hepatitis B. ? You get hemodialysis treatment. ? You take certain medicines for conditions, including cancer, organ transplantation, and autoimmune conditions.  Hepatitis C  Blood testing is recommended for: ? Everyone born from [birth year] through 1966. ? Anyone with known risk factors for hepatitis C.  Sexually transmitted infections (STIs)  You should be screened for sexually transmitted infections (STIs) including gonorrhea and chlamydia if: ? You are sexually active and are younger than 43 years of age. ? You are older than 43 years of age and your health care provider tells you that you are at risk for this type of infection. ? Your sexual activity has changed since you were last screened and you are at an increased risk for chlamydia or gonorrhea. Ask your health care provider if you are at risk.  If you do not have HIV, but are at risk, it may be recommended that you take a prescription medicine daily to prevent HIV infection. This is called pre-exposure prophylaxis (PrEP). You are considered at risk if: ? You are sexually active and do not regularly use condoms or know the HIV status of your partner(s). ? You take drugs by injection. ? You are sexually active with a partner who has HIV.  Talk with your health care provider about whether you are at high risk of being infected with HIV. If  you choose to begin PrEP, you should first be tested for HIV. You should then be tested every 3 months for as long as you are taking PrEP. Pregnancy  If you are premenopausal and you may become pregnant, ask your health care provider about preconception counseling.  If you may become pregnant, take 400 to 800 micrograms (mcg) of folic acid every day.  If you want to prevent pregnancy, talk to your health care provider about birth control (contraception). Osteoporosis and menopause  Osteoporosis is a disease in which the bones lose minerals and strength with aging. This can result in serious bone fractures. Your risk for osteoporosis can be identified using a bone density scan.  If you are 59 years of age or older, or if you are at risk for osteoporosis and fractures, ask your health care provider if you should be screened.  Ask your health care provider whether you should take a calcium or vitamin D supplement to lower your risk for osteoporosis.  Menopause may have certain physical symptoms and risks.  Hormone replacement therapy may reduce some of these symptoms and risks. Talk to your health care provider about whether hormone replacement therapy is right for you. Follow these instructions at home:  Schedule regular health, dental, and eye exams.  Stay current with your immunizations.  Do not use any tobacco products including cigarettes, chewing tobacco, or electronic cigarettes.  If you are pregnant, do not drink alcohol.  If you are breastfeeding, limit how much and how often you drink alcohol.  Limit alcohol intake to no more than 1 drink per day for nonpregnant women. One drink equals 12 ounces of beer, 5 ounces of wine, or 1 ounces of hard liquor.  Do not use street drugs.  Do not share needles.  Ask your health care provider for help if you need support or information about quitting drugs.  Tell your health care provider if you often feel depressed.  Tell your  health care provider if you have ever been abused or do not feel safe at home. This information is not  intended to replace advice given to you by your health care provider. Make sure you discuss any questions you have with your health care provider. Document Released: 10/27/2010 Document Revised: 09/19/2015 Document Reviewed: 01/15/2015 Elsevier Interactive Patient Education  [employer].

## 2017-08-20 ENCOUNTER — Ambulatory Visit
Admission: RE | Admit: 2017-08-20 | Discharge: 2017-08-20 | Disposition: A | Discharge status: Home / Self Care | Payer: Managed Care, Other (non HMO) | Source: Ambulatory Visit | Attending: Obstetrics and Gynecology | Admitting: Obstetrics and Gynecology

## 2017-08-20 DIAGNOSIS — Z1231 Encounter for screening mammogram for malignant neoplasm of breast: Secondary | ICD-10-CM | POA: Insufficient documentation

## 2017-08-20 DIAGNOSIS — R928 Other abnormal and inconclusive findings on diagnostic imaging of breast: Secondary | ICD-10-CM | POA: Diagnosis not present

## 2017-08-23 ENCOUNTER — Other Ambulatory Visit: Payer: Self-pay | Admitting: Obstetrics and Gynecology

## 2017-08-23 DIAGNOSIS — N6320 Unspecified lump in the left breast, unspecified quadrant: Secondary | ICD-10-CM

## 2017-08-23 DIAGNOSIS — R928 Other abnormal and inconclusive findings on diagnostic imaging of breast: Secondary | ICD-10-CM

## 2017-08-25 ENCOUNTER — Ambulatory Visit: Payer: Managed Care, Other (non HMO)

## 2017-08-25 ENCOUNTER — Ambulatory Visit (INDEPENDENT_AMBULATORY_CARE_PROVIDER_SITE_OTHER): Payer: Managed Care, Other (non HMO)

## 2017-08-25 DIAGNOSIS — N938 Other specified abnormal uterine and vaginal bleeding: Secondary | ICD-10-CM

## 2017-08-26 ENCOUNTER — Encounter: Payer: Self-pay | Admitting: Obstetrics and Gynecology

## 2017-08-26 LAB — COMPREHENSIVE METABOLIC PANEL
ALBUMIN: 4.2 g/dL (ref 3.5–5.5)
ALK PHOS: 87 IU/L (ref 39–117)
ALT: 14 IU/L (ref 0–32)
AST: 14 IU/L (ref 0–40)
Albumin/Globulin Ratio: 1.4 (ref 1.2–2.2)
BUN / CREAT RATIO: 14 (ref 9–23)
BUN: 12 mg/dL (ref 6–24)
Bilirubin Total: 0.4 mg/dL (ref 0.0–1.2)
CALCIUM: 9.2 mg/dL (ref 8.7–10.2)
CO2: 22 mmol/L (ref 20–29)
CREATININE: 0.84 mg/dL (ref 0.57–1.00)
Chloride: 103 mmol/L (ref 96–106)
GFR calc Af Amer: 99 mL/min/{1.73_m2} (ref >59)
GFR, EST NON AFRICAN AMERICAN: 86 mL/min/{1.73_m2} (ref >59)
GLOBULIN, TOTAL: 2.9 g/dL (ref 1.5–4.5)
GLUCOSE: 92 mg/dL (ref 65–99)
Potassium: 4.7 mmol/L (ref 3.5–5.2)
SODIUM: 140 mmol/L (ref 134–144)
Total Protein: 7.1 g/dL (ref 6.0–8.5)

## 2017-08-26 LAB — CBC
HEMATOCRIT: 44.5 % (ref 34.0–46.6)
HEMOGLOBIN: 15.4 g/dL (ref 11.1–15.9)
MCH: 31.1 pg (ref 26.6–33.0)
MCHC: 34.6 g/dL (ref 31.5–35.7)
MCV: 90 fL (ref 79–97)
Platelets: 310 10*3/uL (ref 150–379)
RBC: 4.95 x10E6/uL (ref 3.77–5.28)
RDW: 13.9 % (ref 12.3–15.4)
WBC: 8.4 10*3/uL (ref 3.4–10.8)

## 2017-08-26 LAB — LIPID PANEL
CHOLESTEROL TOTAL: 199 mg/dL (ref 100–199)
Chol/HDL Ratio: 5.7 ratio — ABNORMAL HIGH (ref 0.0–4.4)
HDL: 35 mg/dL — AB (ref >39)
LDL CALC: 123 mg/dL — AB (ref 0–99)
Triglycerides: 206 mg/dL — ABNORMAL HIGH (ref 0–149)
VLDL CHOLESTEROL CAL: 41 mg/dL — AB (ref 5–40)

## 2017-08-26 LAB — TSH: TSH: 2.58 u[IU]/mL (ref 0.450–4.500)

## 2017-09-02 ENCOUNTER — Encounter: Payer: Self-pay | Admitting: Gastroenterology

## 2017-09-02 ENCOUNTER — Other Ambulatory Visit: Payer: Self-pay

## 2017-09-02 ENCOUNTER — Ambulatory Visit (INDEPENDENT_AMBULATORY_CARE_PROVIDER_SITE_OTHER): Payer: Managed Care, Other (non HMO) | Admitting: Gastroenterology

## 2017-09-02 VITALS — BP 116/83 | HR 96 | Resp 17 | Ht 63.0 in | Wt 172.2 lb

## 2017-09-02 DIAGNOSIS — R1013 Epigastric pain: Secondary | ICD-10-CM

## 2017-09-02 MED ORDER — OMEPRAZOLE 20 MG PO CPDR: 20.0000 mg | DELAYED_RELEASE_CAPSULE | Freq: Two times a day (BID) | ORAL | 0 refills | Status: DC

## 2017-09-02 NOTE — Progress Notes (Signed)
Arlyss Repress, MD [address]  [address]  [place], [place] [address]  Main: [phone]  Fax: [phone]    Gastroenterology Consultation  Referring Provider:     Hildred Laser, MD Primary Care Physician:  Patient, No Pcp Per Primary Gastroenterologist:  Dr. Arlyss Repress Reason for Consultation:     Epigastric pain        HPI:   Kristi Ibarra is a 42 y.o. female referred by Dr. Hildred Laser  for consultation & management of chronic epigastric pain. Patient reports that she has been experiencing epigastric pain that started approximately 2 years ago. Pain is sharp/stabbing type in the epigastric region, worse after eating, milder in severity but constant throughout the day associated with heartburn particularly at night. She denies right upper quadrant discomfort or radiation. She has been taking Tums for heartburn. She reports that she gained about 30 pounds when she quit smoking 2 years ago for 1 year. She correlates her symptoms with her weight gain. She resumed smoking tobacco. She denies weight loss, loss of appetite. Patient is not willing to quit smoking at this time as she says she is going through a lot of stress with regards to her health problems. she consumes one 12oz soda with supper daily.  She reports that she was noticing mucus in stools which was self-limiting episode, this lasted for 2 months. This was around the time when her menstrual cycles are irregular.She thinks is going through menopause. She denies diarrhea, rectal bleeding, constipation.  She had normal CBC, CMP, TSH. She has hyperlipidemia  NSAIDs: none  Antiplts/Anticoagulants/Anti thrombotics: none  GI Procedures: none She denies drinking alcohol or IV drug abuse Or marijuana use She denies abdominal surgeries She denies family history of GI malignancy   Past Medical History:  Diagnosis Date  . Kidney stones     No past surgical history on file.  Current Outpatient Medications:  .   norethindrone (AYGESTIN) 5 MG tablet, Take 1 tablet (5 mg total) by mouth daily., Disp: 30 tablet, Rfl: 2 .  ketorolac (TORADOL) 10 MG tablet, Take 1 tablet (10 mg total) by mouth every 8 (eight) hours as needed for severe pain. (Patient not taking: Reported on 09/02/2017), Disp: 20 tablet, Rfl: 0 .  omeprazole (PRILOSEC) 20 MG capsule, Take 1 capsule (20 mg total) by mouth 2 (two) times daily before a meal., Disp: 180 capsule, Rfl: 0 .  ondansetron (ZOFRAN) 4 MG tablet, Take 1 tablet (4 mg total) by mouth every 8 (eight) hours as needed for nausea or vomiting. (Patient not taking: Reported on 09/02/2017), Disp: 20 tablet, Rfl: 0 .  tamsulosin (FLOMAX) 0.4 MG CAPS capsule, Take 1 capsule (0.4 mg total) by mouth daily. (Patient not taking: Reported on 02/06/2016), Disp: 14 capsule, Rfl: 0    Family History  Problem Relation Age of Onset  . Asthma Mother   . Stroke Mother      Social History   Tobacco Use  . Smoking status: Current Every Day Smoker    Packs/day: 0.50  . Smokeless tobacco: Never Used  Substance Use Topics  . Alcohol use: No  . Drug use: No    Allergies as of 09/02/2017 - Review Complete 09/02/2017  Allergen Reaction Noted  . Erythromycin Rash 05/19/2015    Review of Systems:    All systems reviewed and negative except where noted in HPI.   Physical Exam:  BP 116/83 (BP Location: Left Arm, Patient Position: Sitting, Cuff Size: Normal)  Pulse 96   Resp 17   Ht  (1.6 m)   Wt 172 lb 3.2 oz (78.1 kg)   BMI 30.50 kg/m  No LMP recorded. (Menstrual status: Irregular Periods).  General:   Alert,  Well-developed, well-nourished, pleasant and cooperative in NAD Head:  Normocephalic and atraumatic. Eyes:  Sclera clear, no icterus.   Conjunctiva pink. Ears:  Normal auditory acuity. Nose:  No deformity, discharge, or lesions. Mouth:  No deformity or lesions,oropharynx pink & moist. Neck:  Supple; no masses or thyromegaly. Lungs:  Respirations even and unlabored.   Clear throughout to auscultation.   No wheezes, crackles, or rhonchi. No acute distress. Heart:  Regular rate and rhythm; no murmurs, clicks, rubs, or gallops. Abdomen:  Normal bowel sounds. Soft,obese, epigastric tenderness and non-distended without masses, hepatosplenomegaly or hernias noted.  No guarding or rebound tenderness.   Rectal: Not performed Msk:  Symmetrical without gross deformities. Good, equal movement & strength bilaterally. Pulses:  Normal pulses noted. Extremities:  No clubbing or edema.  No cyanosis. Neurologic:  Alert and oriented x3;  grossly normal neurologically. Skin:  Intact without significant lesions or rashes. No jaundice. Psych:  Alert and cooperative. Normal mood and affect.  Imaging Studies: No abdominal imaging  Assessment and Plan:   Kristi Ibarra is a 42 y.o. Caucasian female with obesity, hyperlipidemia presents with chronic dyspepsia and heartburn, probably nonulcer dyspepsia secondary to her lifestyle, smoking or functional dyspepsia secondary to stress  - Start omeprazole 20 mg 2 times daily - EGD with biopsies - Advised her on low-fat diet and avoid red meat, sodas - Discuss with her about antireflux measures   Follow up in 4-6 weeks   Arlyss Repress, MD

## 2017-09-02 NOTE — Patient Instructions (Signed)
Indigestion Indigestion is a feeling of pain, discomfort, burning, or fullness in the upper part of your abdomen. It can come and go. It may occur frequently or rarely. Indigestion tends to occur while you are eating or right after you have finished eating. It may be worse at night and while bending over or lying down. Follow these instructions at home: Take these actions to decrease your pain or discomfort and to help avoid complications. Diet  Follow a diet as recommended by your health care provider. This may involve avoiding foods and drinks such as: ? Coffee and tea (with or without caffeine). ? Drinks that contain alcohol. ? Energy drinks and sports drinks. ? Carbonated drinks or sodas. ? Chocolate and cocoa. ? Peppermint and mint flavorings. ? Garlic and onions. ? Horseradish. ? Spicy and acidic foods, including peppers, chili powder, curry powder, vinegar, hot sauces, and barbecue sauce. ? Citrus fruit juices and citrus fruits, such as oranges, lemons, and limes. ? Tomato-based foods, such as red sauce, chili, salsa, and pizza with red sauce. ? Fried and fatty foods, such as donuts, french fries, potato chips, and high-fat dressings. ? High-fat meats, such as hot dogs and fatty cuts of red and white meats, such as rib eye steak, sausage, ham, and bacon. ? High-fat dairy items, such as whole milk, butter, and cream cheese.  Eat small, frequent meals instead of large meals.  Avoid drinking large amounts of liquid with your meals.  Avoid eating meals during the 2-3 hours before bedtime.  Avoid lying down right after you eat.  Do not exercise right after you eat. General instructions  Pay attention to any changes in your symptoms.  Take over-the-counter and prescription medicines only as told by your health care provider. Do not take aspirin, ibuprofen, or other NSAIDs unless your health care provider told you to do so.  Do not use any tobacco products, including cigarettes,  chewing tobacco, and e-cigarettes. If you need help quitting, ask your health care provider.  Wear loose-fitting clothing. Do not wear anything tight around your waist that causes pressure on your abdomen.  Raise (elevate) the head of your bed about 6 inches (15 cm).  Try to reduce your stress, such as with yoga or meditation. If you need help reducing stress, ask your health care provider.  If you are overweight, reduce your weight to an amount that is healthy for you. Ask your health care provider for guidance about a safe weight loss goal.  Keep all follow-up visits as told by your health care provider. This is important. Contact a health care provider if:  You have new symptoms.  You have unexplained weight loss.  You have difficulty swallowing, or it hurts to swallow.  Your symptoms do not improve with treatment.  Your symptoms last for more than two days.  You have a fever.  You vomit. Get help right away if:  You have pain in your arms, neck, jaw, teeth, or back.  You feel sweaty, dizzy, or light-headed.  You faint.  You have chest pain or shortness of breath.  You cannot stop vomiting, or you vomit blood.  Your stool is bloody or black.  You have severe pain in your abdomen. This information is not intended to replace advice given to you by your health care provider. Make sure you discuss any questions you have with your health care provider. Document Released: 05/21/2004 Document Revised: 09/19/2015 Document Reviewed: 08/08/2014 Elsevier Interactive Patient Education  2018 [employer]  

## 2017-09-03 ENCOUNTER — Ambulatory Visit
Admission: RE | Admit: 2017-09-03 | Discharge: 2017-09-03 | Disposition: A | Discharge status: Home / Self Care | Payer: Managed Care, Other (non HMO) | Source: Ambulatory Visit | Attending: Obstetrics and Gynecology | Admitting: Obstetrics and Gynecology

## 2017-09-03 ENCOUNTER — Encounter: Payer: Self-pay | Admitting: Obstetrics and Gynecology

## 2017-09-03 DIAGNOSIS — R928 Other abnormal and inconclusive findings on diagnostic imaging of breast: Secondary | ICD-10-CM | POA: Diagnosis not present

## 2017-09-03 DIAGNOSIS — N6012 Diffuse cystic mastopathy of left breast: Secondary | ICD-10-CM

## 2017-09-03 DIAGNOSIS — N6320 Unspecified lump in the left breast, unspecified quadrant: Secondary | ICD-10-CM | POA: Diagnosis not present

## 2017-09-03 HISTORY — DX: Diffuse cystic mastopathy of left breast: N60.12

## 2017-09-06 ENCOUNTER — Telehealth: Payer: Self-pay | Admitting: Gastroenterology

## 2017-09-06 NOTE — Telephone Encounter (Signed)
Patient has requested to cancel her EGD for tomorrow.  Canceled with Trish in Endo and notified patient that I had canceled the procedure.  Thanks [employer]

## 2017-09-06 NOTE — Telephone Encounter (Signed)
PT LEFT VM TO CANCEL HER PROCEDURE SHE WOULD LIKE A CALL BACK TO VERIFY IT WAS CANCELLED

## 2017-09-07 ENCOUNTER — Ambulatory Visit: Admit: 2017-09-07 | Payer: Managed Care, Other (non HMO) | Admitting: Gastroenterology

## 2017-09-07 SURGERY — ESOPHAGOGASTRODUODENOSCOPY (EGD) WITH PROPOFOL
Anesthesia: General

## 2017-09-10 ENCOUNTER — Encounter: Payer: Self-pay | Admitting: Obstetrics and Gynecology

## 2017-09-13 ENCOUNTER — Other Ambulatory Visit: Payer: Self-pay | Admitting: Obstetrics and Gynecology

## 2017-09-13 ENCOUNTER — Encounter: Payer: Self-pay | Admitting: Obstetrics and Gynecology

## 2017-09-13 MED ORDER — TRANEXAMIC ACID 650 MG PO TABS: 1300.0000 mg | ORAL_TABLET | Freq: Three times a day (TID) | ORAL | 2 refills | Status: DC

## 2017-10-19 ENCOUNTER — Ambulatory Visit: Payer: Managed Care, Other (non HMO) | Admitting: Gastroenterology

## 2017-12-01 ENCOUNTER — Emergency Department
Admission: EM | Admit: 2017-12-01 | Discharge: 2017-12-01 | Disposition: A | Discharge status: Home / Self Care | Payer: Managed Care, Other (non HMO) | Attending: Emergency Medicine | Admitting: Emergency Medicine

## 2017-12-01 ENCOUNTER — Emergency Department: Payer: Managed Care, Other (non HMO)

## 2017-12-01 ENCOUNTER — Encounter: Payer: Self-pay | Admitting: Emergency Medicine

## 2017-12-01 ENCOUNTER — Other Ambulatory Visit: Payer: Self-pay

## 2017-12-01 DIAGNOSIS — Z79899 Other long term (current) drug therapy: Secondary | ICD-10-CM | POA: Diagnosis not present

## 2017-12-01 DIAGNOSIS — N132 Hydronephrosis with renal and ureteral calculous obstruction: Secondary | ICD-10-CM | POA: Insufficient documentation

## 2017-12-01 DIAGNOSIS — R1032 Left lower quadrant pain: Secondary | ICD-10-CM | POA: Diagnosis present

## 2017-12-01 DIAGNOSIS — N200 Calculus of kidney: Secondary | ICD-10-CM

## 2017-12-01 DIAGNOSIS — F17210 Nicotine dependence, cigarettes, uncomplicated: Secondary | ICD-10-CM | POA: Insufficient documentation

## 2017-12-01 LAB — URINALYSIS, ROUTINE W REFLEX MICROSCOPIC
Bacteria, UA: NONE SEEN
Bilirubin Urine: NEGATIVE
GLUCOSE, UA: NEGATIVE mg/dL
KETONES UR: NEGATIVE mg/dL
Leukocytes, UA: NEGATIVE
Nitrite: NEGATIVE
PROTEIN: 100 mg/dL — AB
Specific Gravity, Urine: 1.033 — ABNORMAL HIGH (ref 1.005–1.030)
pH: 5 (ref 5.0–8.0)

## 2017-12-01 LAB — POCT PREGNANCY, URINE: Preg Test, Ur: NEGATIVE

## 2017-12-01 MED ORDER — PHENAZOPYRIDINE HCL 200 MG PO TABS
200.0000 mg | ORAL_TABLET | Freq: Once | ORAL | Status: AC
  Administered 2017-12-01: 200 mg via ORAL
  Filled 2017-12-01: qty 1

## 2017-12-01 MED ORDER — TAMSULOSIN HCL 0.4 MG PO CAPS: 0.4000 mg | ORAL_CAPSULE | Freq: Every day | ORAL | 0 refills | Status: DC

## 2017-12-01 MED ORDER — HYDROCODONE-ACETAMINOPHEN 5-325 MG PO TABS
1.0000 | ORAL_TABLET | Freq: Once | ORAL | Status: AC
Start: 2017-12-01 — End: 2017-12-01
  Administered 2017-12-01: 1 via ORAL
  Filled 2017-12-01: qty 1

## 2017-12-01 MED ORDER — ONDANSETRON 4 MG PO TBDP
8.0000 mg | ORAL_TABLET | Freq: Once | ORAL | Status: AC
  Administered 2017-12-01: 8 mg via ORAL
  Filled 2017-12-01: qty 2

## 2017-12-01 MED ORDER — TAMSULOSIN HCL 0.4 MG PO CAPS
0.4000 mg | ORAL_CAPSULE | Freq: Once | ORAL | Status: AC
  Administered 2017-12-01: 0.4 mg via ORAL
  Filled 2017-12-01: qty 1

## 2017-12-01 MED ORDER — HYDROCODONE-ACETAMINOPHEN 5-325 MG PO TABS: 1.0000 | ORAL_TABLET | Freq: Four times a day (QID) | ORAL | 0 refills | Status: DC | PRN

## 2017-12-01 MED ORDER — ONDANSETRON 4 MG PO TBDP: 4.0000 mg | ORAL_TABLET | Freq: Three times a day (TID) | ORAL | 0 refills | Status: DC | PRN

## 2017-12-01 MED ORDER — KETOROLAC TROMETHAMINE 30 MG/ML IJ SOLN
30.0000 mg | Freq: Once | INTRAMUSCULAR | Status: AC
  Administered 2017-12-01: 30 mg via INTRAMUSCULAR
  Filled 2017-12-01: qty 1

## 2017-12-01 MED ORDER — IBUPROFEN 600 MG PO TABS: 600.0000 mg | ORAL_TABLET | Freq: Three times a day (TID) | ORAL | 0 refills | Status: DC | PRN

## 2017-12-01 NOTE — ED Provider Notes (Signed)
[facility] Emergency Department Provider Note  ____________________________________________   First MD Initiated Contact with Patient 12/01/17 [phone]     (approximate)  I have reviewed the triage vital signs and the nursing notes.   HISTORY  Chief Complaint Abdominal Pain   HPI Kristi Ibarra is a 42 y.o. female who self presents the emergency department with sudden onset severe left groin pain that woke her from sleep this morning around 2:30 in the morning roughly 4 hours prior to arrival.  The patient has had one kidney stone in the past and this feels similar although not identical.  At that point she had back pain she has none today.  Her pain is in her left groin radiating up towards her left flank.  She has some nausea.  She denies dysuria frequency hesitancy hematuria or chills.  Her pain is severe sharp nothing seems to make it better or worse.  No history of abdominal surgeries.    Past Medical History:  Diagnosis Date  . Fibrocystic breast changes, left 09/03/2017  . Kidney stones     Patient Active Problem List   Diagnosis Date Noted  . Fibrocystic breast changes, left 09/03/2017  . DUB (dysfunctional uterine bleeding) 02/06/2016    History reviewed. No pertinent surgical history.  Prior to Admission medications   Medication Sig Start Date End Date Taking? Authorizing Provider  HYDROcodone-acetaminophen (NORCO) 5-325 MG tablet Take 1 tablet by mouth every 6 (six) hours as needed for up to 7 doses for severe pain. 12/01/17   Merrily Brittle, MD  ibuprofen (ADVIL,MOTRIN) 600 MG tablet Take 1 tablet (600 mg total) by mouth every 8 (eight) hours as needed. 12/01/17   Merrily Brittle, MD  ketorolac (TORADOL) 10 MG tablet Take 1 tablet (10 mg total) by mouth every 8 (eight) hours as needed for severe pain. Patient not taking: Reported on 09/02/2017 05/19/15   Phineas Semen, MD  omeprazole (PRILOSEC) 20 MG capsule Take 1 capsule (20 mg total) by mouth 2  (two) times daily before a meal. 09/02/17 12/01/17  Vanga, Loel Dubonnet, MD  ondansetron (ZOFRAN ODT) 4 MG disintegrating tablet Take 1 tablet (4 mg total) by mouth every 8 (eight) hours as needed for nausea or vomiting. 12/01/17   Merrily Brittle, MD  ondansetron (ZOFRAN) 4 MG tablet Take 1 tablet (4 mg total) by mouth every 8 (eight) hours as needed for nausea or vomiting. Patient not taking: Reported on 09/02/2017 05/19/15   Phineas Semen, MD  tamsulosin (FLOMAX) 0.4 MG CAPS capsule Take 1 capsule (0.4 mg total) by mouth daily. 12/01/17   Merrily Brittle, MD  tranexamic acid (LYSTEDA) 650 MG TABS tablet Take 2 tablets (1,300 mg total) by mouth 3 (three) times daily. Take during menses for a maximum of five days 09/13/17   Hildred Laser, MD    Allergies Erythromycin  Family History  Problem Relation Age of Onset  . Asthma Mother   . Stroke Mother     Social History Social History   Tobacco Use  . Smoking status: Current Every Day Smoker    Packs/day: 0.50  . Smokeless tobacco: Never Used  Substance Use Topics  . Alcohol use: No  . Drug use: No    Review of Systems Constitutional: No fever/chills Eyes: No visual changes. ENT: No sore throat. Cardiovascular: Denies chest pain. Respiratory: Denies shortness of breath. Gastrointestinal: Positive for abdominal pain.  Positive for nausea, no vomiting.  No diarrhea.  No constipation. Genitourinary: Negative for dysuria. Musculoskeletal: Negative  for back pain. Skin: Negative for rash. Neurological: Negative for headaches, focal weakness or numbness.   ____________________________________________   PHYSICAL EXAM:  VITAL SIGNS: ED Triage Vitals  Enc Vitals Group     BP 12/01/17 0548 (!) 127/97     Pulse Rate 12/01/17 0548 69     Resp 12/01/17 0548 20     Temp 12/01/17 0548 97.8 F (36.6 C)     Temp Source 12/01/17 0548 Oral     SpO2 12/01/17 0548 99 %     Weight 12/01/17 0549 172 lb (78 kg)     Height 12/01/17 0549 5\' 3"   (1.6 m)     Head Circumference --      Peak Flow --      Pain Score 12/01/17 0549 10     Pain Loc --      Pain Edu? --      Excl. in GC? --     Constitutional: Alert and oriented x4 obviously uncomfortable nontoxic no diaphoresis speaks full clear sentences Eyes: PERRL EOMI. Head: Atraumatic. Nose: No congestion/rhinnorhea. Mouth/Throat: No trismus Neck: No stridor.   Cardiovascular: Normal rate, regular rhythm. Grossly normal heart sounds.  Good peripheral circulation. Respiratory: Normal respiratory effort.  No retractions. Lungs CTAB and moving good air Gastrointestinal: Soft nondistended somewhat tender left lower quadrant with no rebound or guarding no peritonitis no costovertebral tenderness Musculoskeletal: No lower extremity edema   Neurologic:  Normal speech and language. No gross focal neurologic deficits are appreciated. Skin:  Skin is warm, dry and intact. No rash noted. Psychiatric: Mood and affect are normal. Speech and behavior are normal.    ____________________________________________   DIFFERENTIAL includes but not limited to  Nephrolithiasis, pyelonephritis, urinary tract infection, diverticulitis, appendicitis ____________________________________________   LABS (all labs ordered are listed, but only abnormal results are displayed)  Labs Reviewed  URINALYSIS, ROUTINE W REFLEX MICROSCOPIC - Abnormal; Notable for the following components:      Result Value   Color, Urine AMBER (*)    APPearance CLOUDY (*)    Specific Gravity, Urine 1.033 (*)    Hgb urine dipstick LARGE (*)    Protein, ur 100 (*)    RBC / HPF >50 (*)    All other components within normal limits  POC URINE PREG, ED  POCT PREGNANCY, URINE    Lab work reviewed by me consistent with hematuria and dehydration __________________________________________  EKG   ____________________________________________  RADIOLOGY  CT stone reviewed by me consistent with 4 mm distal  stone ____________________________________________   PROCEDURES  Procedure(s) performed: no  Procedures  Critical Care performed: no  ____________________________________________   INITIAL IMPRESSION / ASSESSMENT AND PLAN / ED COURSE  Pertinent labs & imaging results that were available during my care of the patient were reviewed by me and considered in my medical decision making (see chart for details).   As part of my medical decision making, I reviewed the following data within the electronic MEDICAL RECORD NUMBER History obtained from family if available, nursing notes, old chart and ekg, as well as notes from prior ED visits.  The patient does have hematuria and left groin and left flank pain although is somewhat tender in the left lower quadrant.  This feels different from her previous kidney stone and I do have some mild concern for diverticulitis or other intra-abdominal process not secondary to renal colic.  30 mg of IM Toradol as well as oral hydrocodone and ondansetron now and we will CT her to evaluate  for stone.     Patient CT scan is consistent with a 4 mm stone on the left.  Pain is improved.  I will treat her with Norco ibuprofen and Zofran and refer her to urology as an outpatient. ____________________________________________   FINAL CLINICAL IMPRESSION(S) / ED DIAGNOSES  Final diagnoses:  Kidney stone      NEW MEDICATIONS STARTED DURING THIS VISIT:  Discharge Medication List as of 12/01/2017  8:19 AM    START taking these medications   Details  HYDROcodone-acetaminophen (NORCO) 5-325 MG tablet Take 1 tablet by mouth every 6 (six) hours as needed for up to 7 doses for severe pain., Starting Wed 12/01/2017, Print    ibuprofen (ADVIL,MOTRIN) 600 MG tablet Take 1 tablet (600 mg total) by mouth every 8 (eight) hours as needed., Starting Wed 12/01/2017, Print    ondansetron (ZOFRAN ODT) 4 MG disintegrating tablet Take 1 tablet (4 mg total) by mouth every 8 (eight)  hours as needed for nausea or vomiting., Starting Wed 12/01/2017, Print         Note:  This document was prepared using Dragon voice recognition software and may include unintentional dictation errors.     Merrily Brittleifenbark, Xavier Munger, MD 12/04/17 [phone]

## 2017-12-01 NOTE — Discharge Instructions (Signed)
Please take your pain medication as needed for severe symptoms and make an appointment to follow-up with urologist as needed.  Return to the emergency department immediately if you develop any fevers or chills or if your pain is worsening.  It was a pleasure to take care of you today, and thank you for coming to our emergency department.  If you have any questions or concerns before leaving please ask the nurse to grab me and I'm more than happy to go through your aftercare instructions again.  If you were prescribed any opioid pain medication today such as Norco, Vicodin, Percocet, morphine, hydrocodone, or oxycodone please make sure you do not drive when you are taking this medication as it can alter your ability to drive safely.  If you have any concerns once you are home that you are not improving or are in fact getting worse before you can make it to your follow-up appointment, please do not hesitate to call 911 and come back for further evaluation.  Merrily Brittle, MD  Results for orders placed or performed during the hospital encounter of 12/01/17  Urinalysis, Routine w reflex microscopic  Result Value Ref Range   Color, Urine AMBER (A) YELLOW   APPearance CLOUDY (A) CLEAR   Specific Gravity, Urine 1.033 (H) 1.005 - 1.030   pH 5.0 5.0 - 8.0   Glucose, UA NEGATIVE NEGATIVE mg/dL   Hgb urine dipstick LARGE (A) NEGATIVE   Bilirubin Urine NEGATIVE NEGATIVE   Ketones, ur NEGATIVE NEGATIVE mg/dL   Protein, ur 161 (A) NEGATIVE mg/dL   Nitrite NEGATIVE NEGATIVE   Leukocytes, UA NEGATIVE NEGATIVE   RBC / HPF >50 (H) 0 - 5 RBC/hpf   WBC, UA 0-5 0 - 5 WBC/hpf   Bacteria, UA NONE SEEN NONE SEEN   Squamous Epithelial / LPF 11-20 0 - 5   Mucus PRESENT   Pregnancy, urine POC  Result Value Ref Range   Preg Test, Ur NEGATIVE NEGATIVE   Ct Renal Stone Study  Result Date: 12/01/2017 CLINICAL DATA:  Left flank and inguinal region pain EXAM: CT ABDOMEN AND PELVIS WITHOUT CONTRAST TECHNIQUE:  Multidetector CT imaging of the abdomen and pelvis was performed following the standard protocol without oral or IV contrast. COMPARISON:  May 19, 2015 FINDINGS: Lower chest: There is bibasilar atelectasis. No lung base consolidation. Hepatobiliary: No focal liver lesions are evident on this noncontrast enhanced study. There are several tiny noncalcified gallstones, potentially containing nitrogen given the decreased attenuation of these tiny gallstones. No gallbladder wall thickening evident. No biliary duct dilatation. Pancreas: No pancreatic mass or inflammatory focus. Spleen: No splenic lesions are evident. There is a small accessory spleen medially. Adrenals/Urinary Tract: Adrenals bilaterally appear normal. There is no evident renal mass on either side. There is mild hydronephrosis on the left. There is no hydronephrosis on the right. Left kidney is subtly edematous. No intrarenal calculus is evident on either side. There is a 4 mm calculus in the distal left ureter. No other ureteral calculi are evident. Urinary bladder is midline with wall thickness within normal limits. Stomach/Bowel: There are occasional sigmoid diverticula without diverticulitis. There is no appreciable bowel wall or mesenteric thickening. No evident bowel obstruction. No free air or portal venous air. Vascular/Lymphatic: No abdominal aortic aneurysm. Minimal calcification noted in the aorta. Major mesenteric vessels appear patent on this noncontrast enhanced study. No adenopathy is appreciable in the abdomen or pelvis. There are a few tiny mesenteric lymph nodes, regarded as nonspecific. Reproductive: The uterus is  anteverted. There is no evident pelvic mass. Other: The appendix appears normal. No abscess or ascites is evident in the abdomen or pelvis. Musculoskeletal: There are no blastic or lytic bone lesions. There is no intramuscular or abdominal wall lesion. IMPRESSION: 1. 4 mm calculus distal left ureter with mild  hydronephrosis on the left. Left kidney is subtly edematous. 2. Cholelithiasis with several small noncalcified gallstones. No gallbladder wall thickening evident by CT. 3. No evident bowel obstruction. No abscess in the abdomen or pelvis. Appendix appears normal. There are occasional sigmoid diverticula without diverticulitis. Electronically Signed   By: Bretta BangWilliam  Woodruff III M.D.   On: 12/01/2017 07:45

## 2017-12-01 NOTE — ED Triage Notes (Signed)
Pt presents to ED with left sided groin pain that radiates from her left hip around to her lower abd / pelvis. Pt states her pain woke her up from sleep around 0230. Similar pain previously with kidney stone. Pain is sharp and constant; nothing seems to make the pain improve. Pt denies dysuria or hematuria.

## 2018-03-29 ENCOUNTER — Other Ambulatory Visit: Payer: Self-pay | Admitting: Gastroenterology

## 2018-06-03 ENCOUNTER — Observation Stay: Payer: Managed Care, Other (non HMO) | Admitting: Anesthesiology

## 2018-06-03 ENCOUNTER — Emergency Department: Payer: Managed Care, Other (non HMO)

## 2018-06-03 ENCOUNTER — Other Ambulatory Visit: Payer: Self-pay

## 2018-06-03 ENCOUNTER — Observation Stay
Admission: EM | Admit: 2018-06-03 | Discharge: 2018-06-04 | Disposition: A | Discharge status: Home / Self Care | Payer: Managed Care, Other (non HMO) | Attending: Surgery | Admitting: Surgery

## 2018-06-03 ENCOUNTER — Encounter
Admission: EM | Disposition: A | Discharge status: Home / Self Care | Payer: Self-pay | Source: Home / Self Care | Attending: Emergency Medicine

## 2018-06-03 ENCOUNTER — Encounter: Payer: Self-pay | Admitting: Emergency Medicine

## 2018-06-03 DIAGNOSIS — K219 Gastro-esophageal reflux disease without esophagitis: Secondary | ICD-10-CM | POA: Insufficient documentation

## 2018-06-03 DIAGNOSIS — R109 Unspecified abdominal pain: Secondary | ICD-10-CM | POA: Diagnosis present

## 2018-06-03 DIAGNOSIS — K810 Acute cholecystitis: Secondary | ICD-10-CM

## 2018-06-03 DIAGNOSIS — F17210 Nicotine dependence, cigarettes, uncomplicated: Secondary | ICD-10-CM | POA: Insufficient documentation

## 2018-06-03 DIAGNOSIS — K8050 Calculus of bile duct without cholangitis or cholecystitis without obstruction: Secondary | ICD-10-CM

## 2018-06-03 DIAGNOSIS — K8012 Calculus of gallbladder with acute and chronic cholecystitis without obstruction: Principal | ICD-10-CM | POA: Insufficient documentation

## 2018-06-03 DIAGNOSIS — K8020 Calculus of gallbladder without cholecystitis without obstruction: Secondary | ICD-10-CM

## 2018-06-03 HISTORY — PX: GALLBLADDER SURGERY: SHX652

## 2018-06-03 HISTORY — PX: CHOLECYSTECTOMY: SHX55

## 2018-06-03 LAB — CBC WITH DIFFERENTIAL/PLATELET
ABS IMMATURE GRANULOCYTES: 0.06 10*3/uL (ref 0.00–0.07)
Basophils Absolute: 0 10*3/uL (ref 0.0–0.1)
Basophils Relative: 0 %
Eosinophils Absolute: 0.1 10*3/uL (ref 0.0–0.5)
Eosinophils Relative: 1 %
HEMATOCRIT: 44.1 % (ref 36.0–46.0)
Hemoglobin: 14.5 g/dL (ref 12.0–15.0)
IMMATURE GRANULOCYTES: 1 %
LYMPHS ABS: 3.4 10*3/uL (ref 0.7–4.0)
LYMPHS PCT: 26 %
MCH: 29.8 pg (ref 26.0–34.0)
MCHC: 32.9 g/dL (ref 30.0–36.0)
MCV: 90.7 fL (ref 80.0–100.0)
MONOS PCT: 5 %
Monocytes Absolute: 0.7 10*3/uL (ref 0.1–1.0)
NEUTROS ABS: 9 10*3/uL — AB (ref 1.7–7.7)
NEUTROS PCT: 67 %
Platelets: 335 10*3/uL (ref 150–400)
RBC: 4.86 MIL/uL (ref 3.87–5.11)
RDW: 12.8 % (ref 11.5–15.5)
WBC: 13.1 10*3/uL — ABNORMAL HIGH (ref 4.0–10.5)
nRBC: 0 % (ref 0.0–0.2)

## 2018-06-03 LAB — URINALYSIS, COMPLETE (UACMP) WITH MICROSCOPIC
BACTERIA UA: NONE SEEN
BILIRUBIN URINE: NEGATIVE
Glucose, UA: NEGATIVE mg/dL
Ketones, ur: 5 mg/dL — AB
Nitrite: NEGATIVE
PROTEIN: NEGATIVE mg/dL
SPECIFIC GRAVITY, URINE: 1.029 (ref 1.005–1.030)
pH: 5 (ref 5.0–8.0)

## 2018-06-03 LAB — TROPONIN I: Troponin I: <0.03 ng/mL (ref <0.03)

## 2018-06-03 LAB — COMPREHENSIVE METABOLIC PANEL
ALBUMIN: 3.8 g/dL (ref 3.5–5.0)
ALT: 15 U/L (ref 0–44)
AST: 15 U/L (ref 15–41)
Alkaline Phosphatase: 84 U/L (ref 38–126)
Anion gap: 6 (ref 5–15)
BILIRUBIN TOTAL: 0.4 mg/dL (ref 0.3–1.2)
BUN: 9 mg/dL (ref 6–20)
CHLORIDE: 106 mmol/L (ref 98–111)
CO2: 27 mmol/L (ref 22–32)
CREATININE: 0.63 mg/dL (ref 0.44–1.00)
Calcium: 9.3 mg/dL (ref 8.9–10.3)
GFR calc Af Amer: >60 mL/min (ref >60)
GLUCOSE: 137 mg/dL — AB (ref 70–99)
Potassium: 3.9 mmol/L (ref 3.5–5.1)
Sodium: 139 mmol/L (ref 135–145)
TOTAL PROTEIN: 7.1 g/dL (ref 6.5–8.1)

## 2018-06-03 LAB — PREGNANCY, URINE: PREG TEST UR: NEGATIVE

## 2018-06-03 LAB — LIPASE, BLOOD: LIPASE: 23 U/L (ref 11–51)

## 2018-06-03 SURGERY — LAPAROSCOPIC CHOLECYSTECTOMY WITH INTRAOPERATIVE CHOLANGIOGRAM
Anesthesia: General | Site: Abdomen

## 2018-06-03 MED ORDER — BUPIVACAINE-EPINEPHRINE (PF) 0.25% -1:200000 IJ SOLN
INTRAMUSCULAR | Status: AC
  Filled 2018-06-03: qty 30

## 2018-06-03 MED ORDER — ONDANSETRON HCL 4 MG/2ML IJ SOLN
4.0000 mg | Freq: Once | INTRAMUSCULAR | Status: AC
  Administered 2018-06-03: 4 mg via INTRAVENOUS
  Filled 2018-06-03: qty 2

## 2018-06-03 MED ORDER — FENTANYL CITRATE (PF) 100 MCG/2ML IJ SOLN
INTRAMUSCULAR | Status: AC
  Filled 2018-06-03: qty 2

## 2018-06-03 MED ORDER — LACTATED RINGERS IV SOLN
125.0000 mL/h | INTRAVENOUS | Status: DC
  Administered 2018-06-03 – 2018-06-04 (×3): 125 mL/h via INTRAVENOUS

## 2018-06-03 MED ORDER — DEXAMETHASONE SODIUM PHOSPHATE 10 MG/ML IJ SOLN
INTRAMUSCULAR | Status: AC
  Filled 2018-06-03: qty 1

## 2018-06-03 MED ORDER — DEXAMETHASONE SODIUM PHOSPHATE 10 MG/ML IJ SOLN
INTRAMUSCULAR | Status: DC | PRN
  Administered 2018-06-03: 10 mg via INTRAVENOUS

## 2018-06-03 MED ORDER — PIPERACILLIN-TAZOBACTAM 3.375 G IVPB 30 MIN
3.3750 g | Freq: Once | INTRAVENOUS | Status: AC
  Administered 2018-06-03 (×2): 3.375 g via INTRAVENOUS
  Filled 2018-06-03: qty 50

## 2018-06-03 MED ORDER — METOPROLOL TARTRATE 5 MG/5ML IV SOLN
INTRAVENOUS | Status: DC | PRN
  Administered 2018-06-03: 5 mg via INTRAVENOUS

## 2018-06-03 MED ORDER — SUGAMMADEX SODIUM 200 MG/2ML IV SOLN
INTRAVENOUS | Status: DC | PRN
  Administered 2018-06-03: 150 mg via INTRAVENOUS

## 2018-06-03 MED ORDER — POLYETHYLENE GLYCOL 3350 17 G PO PACK
17.0000 g | PACK | Freq: Every day | ORAL | Status: DC | PRN
  Filled 2018-06-03: qty 1

## 2018-06-03 MED ORDER — ONDANSETRON HCL 4 MG/2ML IJ SOLN
4.0000 mg | Freq: Four times a day (QID) | INTRAMUSCULAR | Status: DC | PRN
  Administered 2018-06-03: 4 mg via INTRAVENOUS
  Filled 2018-06-03: qty 2

## 2018-06-03 MED ORDER — PANTOPRAZOLE SODIUM 40 MG IV SOLR
40.0000 mg | Freq: Every day | INTRAVENOUS | Status: DC
  Administered 2018-06-03: 40 mg via INTRAVENOUS
  Filled 2018-06-03: qty 40

## 2018-06-03 MED ORDER — SUCCINYLCHOLINE CHLORIDE 20 MG/ML IJ SOLN
INTRAMUSCULAR | Status: AC
  Filled 2018-06-03: qty 1

## 2018-06-03 MED ORDER — PANTOPRAZOLE SODIUM 40 MG IV SOLR
40.0000 mg | Freq: Every day | INTRAVENOUS | Status: DC
  Filled 2018-06-03: qty 40

## 2018-06-03 MED ORDER — LIDOCAINE HCL (CARDIAC) PF 100 MG/5ML IV SOSY
PREFILLED_SYRINGE | INTRAVENOUS | Status: DC | PRN
  Administered 2018-06-03: 50 mg via INTRAVENOUS

## 2018-06-03 MED ORDER — FENTANYL CITRATE (PF) 100 MCG/2ML IJ SOLN
INTRAMUSCULAR | Status: AC
  Administered 2018-06-03: 25 ug via INTRAVENOUS
  Filled 2018-06-03: qty 2

## 2018-06-03 MED ORDER — SUCCINYLCHOLINE CHLORIDE 20 MG/ML IJ SOLN
INTRAMUSCULAR | Status: DC | PRN
  Administered 2018-06-03: 100 mg via INTRAVENOUS

## 2018-06-03 MED ORDER — FENTANYL CITRATE (PF) 100 MCG/2ML IJ SOLN
INTRAMUSCULAR | Status: DC | PRN
  Administered 2018-06-03 (×2): 50 ug via INTRAVENOUS
  Administered 2018-06-03: 100 ug via INTRAVENOUS

## 2018-06-03 MED ORDER — PIPERACILLIN-TAZOBACTAM 3.375 G IVPB
INTRAVENOUS | Status: AC
  Administered 2018-06-03: 3.375 g via INTRAVENOUS
  Filled 2018-06-03: qty 50

## 2018-06-03 MED ORDER — OXYCODONE HCL 5 MG PO TABS
5.0000 mg | ORAL_TABLET | ORAL | Status: DC | PRN
  Filled 2018-06-03: qty 1

## 2018-06-03 MED ORDER — BUPIVACAINE-EPINEPHRINE (PF) 0.25% -1:200000 IJ SOLN
INTRAMUSCULAR | Status: DC | PRN
  Administered 2018-06-03: 30 mL via PERINEURAL

## 2018-06-03 MED ORDER — GLYCOPYRROLATE 0.2 MG/ML IJ SOLN
INTRAMUSCULAR | Status: DC | PRN
  Administered 2018-06-03: 0.2 mg via INTRAVENOUS

## 2018-06-03 MED ORDER — SODIUM CHLORIDE 0.9 % IV SOLN: 2.0000 g | INTRAVENOUS | Status: DC

## 2018-06-03 MED ORDER — PNEUMOCOCCAL VAC POLYVALENT 25 MCG/0.5ML IJ INJ
0.5000 mL | INJECTION | INTRAMUSCULAR | Status: DC
  Filled 2018-06-03: qty 0.5

## 2018-06-03 MED ORDER — MIDAZOLAM HCL 2 MG/2ML IJ SOLN
INTRAMUSCULAR | Status: AC
  Filled 2018-06-03: qty 2

## 2018-06-03 MED ORDER — ONDANSETRON HCL 4 MG/2ML IJ SOLN: 4.0000 mg | Freq: Once | INTRAMUSCULAR | Status: DC | PRN

## 2018-06-03 MED ORDER — MORPHINE SULFATE (PF) 2 MG/ML IV SOLN: 1.0000 mg | INTRAVENOUS | Status: DC | PRN

## 2018-06-03 MED ORDER — ROCURONIUM BROMIDE 50 MG/5ML IV SOLN
INTRAVENOUS | Status: AC
  Filled 2018-06-03: qty 1

## 2018-06-03 MED ORDER — ROCURONIUM BROMIDE 100 MG/10ML IV SOLN
INTRAVENOUS | Status: DC | PRN
  Administered 2018-06-03: 35 mg via INTRAVENOUS
  Administered 2018-06-03: 10 mg via INTRAVENOUS
  Administered 2018-06-03: 5 mg via INTRAVENOUS

## 2018-06-03 MED ORDER — ONDANSETRON HCL 4 MG/2ML IJ SOLN: 4.0000 mg | Freq: Four times a day (QID) | INTRAMUSCULAR | Status: DC | PRN

## 2018-06-03 MED ORDER — ONDANSETRON 4 MG PO TBDP: 4.0000 mg | ORAL_TABLET | Freq: Four times a day (QID) | ORAL | Status: DC | PRN

## 2018-06-03 MED ORDER — LACTATED RINGERS IV SOLN
INTRAVENOUS | Status: DC
  Administered 2018-06-03: 15:00:00 via INTRAVENOUS

## 2018-06-03 MED ORDER — PIPERACILLIN-TAZOBACTAM 3.375 G IVPB
3.3750 g | Freq: Three times a day (TID) | INTRAVENOUS | Status: DC
  Administered 2018-06-04 (×2): 3.375 g via INTRAVENOUS
  Filled 2018-06-03 (×5): qty 50

## 2018-06-03 MED ORDER — ACETAMINOPHEN 500 MG PO TABS
1000.0000 mg | ORAL_TABLET | Freq: Four times a day (QID) | ORAL | Status: DC
  Administered 2018-06-03 – 2018-06-04 (×3): 1000 mg via ORAL
  Filled 2018-06-03 (×3): qty 2

## 2018-06-03 MED ORDER — KETOROLAC TROMETHAMINE 30 MG/ML IJ SOLN
30.0000 mg | Freq: Once | INTRAMUSCULAR | Status: AC
  Administered 2018-06-03: 30 mg via INTRAVENOUS
  Filled 2018-06-03: qty 1

## 2018-06-03 MED ORDER — PROPOFOL 10 MG/ML IV BOLUS
INTRAVENOUS | Status: DC | PRN
  Administered 2018-06-03: 160 mg via INTRAVENOUS

## 2018-06-03 MED ORDER — GLYCOPYRROLATE 0.2 MG/ML IJ SOLN
INTRAMUSCULAR | Status: AC
  Filled 2018-06-03: qty 1

## 2018-06-03 MED ORDER — MIDAZOLAM HCL 2 MG/2ML IJ SOLN
INTRAMUSCULAR | Status: DC | PRN
  Administered 2018-06-03: 2 mg via INTRAVENOUS

## 2018-06-03 MED ORDER — DEXTROSE IN LACTATED RINGERS 5 % IV SOLN: INTRAVENOUS | Status: DC

## 2018-06-03 MED ORDER — LABETALOL HCL 5 MG/ML IV SOLN
INTRAVENOUS | Status: AC
  Filled 2018-06-03: qty 4

## 2018-06-03 MED ORDER — LABETALOL HCL 5 MG/ML IV SOLN
INTRAVENOUS | Status: DC | PRN
  Administered 2018-06-03: 10 mg via INTRAVENOUS

## 2018-06-03 MED ORDER — HYDROMORPHONE HCL 1 MG/ML IJ SOLN: 0.5000 mg | INTRAMUSCULAR | Status: DC | PRN

## 2018-06-03 MED ORDER — FENTANYL CITRATE (PF) 100 MCG/2ML IJ SOLN
25.0000 ug | INTRAMUSCULAR | Status: DC | PRN
  Administered 2018-06-03 (×4): 25 ug via INTRAVENOUS

## 2018-06-03 MED ORDER — ENOXAPARIN SODIUM 40 MG/0.4ML ~~LOC~~ SOLN
40.0000 mg | SUBCUTANEOUS | Status: DC
  Administered 2018-06-04: 40 mg via SUBCUTANEOUS
  Filled 2018-06-03: qty 0.4

## 2018-06-03 MED ORDER — ONDANSETRON HCL 4 MG/2ML IJ SOLN
INTRAMUSCULAR | Status: AC
  Filled 2018-06-03: qty 2

## 2018-06-03 MED ORDER — SODIUM CHLORIDE 0.9 % IV SOLN
Freq: Once | INTRAVENOUS | Status: AC
  Administered 2018-06-03: 10:00:00 via INTRAVENOUS

## 2018-06-03 MED ORDER — ONDANSETRON HCL 4 MG/2ML IJ SOLN
INTRAMUSCULAR | Status: DC | PRN
  Administered 2018-06-03: 4 mg via INTRAVENOUS

## 2018-06-03 MED ORDER — KETOROLAC TROMETHAMINE 30 MG/ML IJ SOLN
30.0000 mg | Freq: Four times a day (QID) | INTRAMUSCULAR | Status: DC
  Administered 2018-06-03 – 2018-06-04 (×3): 30 mg via INTRAVENOUS
  Filled 2018-06-03 (×3): qty 1

## 2018-06-03 SURGICAL SUPPLY — 55 items
ADH SKN CLS APL DERMABOND .7 (GAUZE/BANDAGES/DRESSINGS) ×1
APPLIER CLIP 5 13 M/L LIGAMAX5 (MISCELLANEOUS) ×3
APPLIER CLIP ROT 10 11.4 M/L (STAPLE) ×3
APR CLP MED LRG 11.4X10 (STAPLE) ×1
APR CLP MED LRG 5 ANG JAW (MISCELLANEOUS) ×1
BAG SPEC RTRVL LRG 6X4 10 (ENDOMECHANICALS) ×1
BLADE SURG 15 STRL LF DISP TIS (BLADE) ×1 IMPLANT
BLADE SURG 15 STRL SS (BLADE) ×3
CANISTER SUCT 1200ML W/VALVE (MISCELLANEOUS) ×3 IMPLANT
CATH CHOLANGI 4FR 420404F (CATHETERS) IMPLANT
CHLORAPREP W/TINT 26ML (MISCELLANEOUS) ×3 IMPLANT
CLIP APPLIE 5 13 M/L LIGAMAX5 (MISCELLANEOUS) ×1 IMPLANT
CLIP APPLIE ROT 10 11.4 M/L (STAPLE) IMPLANT
COVER WAND RF STERILE (DRAPES) ×3 IMPLANT
DEFOGGER SCOPE WARMER CLEARIFY (MISCELLANEOUS) ×2 IMPLANT
DERMABOND ADVANCED (GAUZE/BANDAGES/DRESSINGS) ×2
DERMABOND ADVANCED .7 DNX12 (GAUZE/BANDAGES/DRESSINGS) ×1 IMPLANT
DRAPE C-ARM XRAY 36X54 (DRAPES) IMPLANT
ELECT REM PT RETURN 9FT ADLT (ELECTROSURGICAL) ×3
ELECTRODE REM PT RTRN 9FT ADLT (ELECTROSURGICAL) ×1 IMPLANT
FILTER LAP SMOKE EVAC STRL (MISCELLANEOUS) ×3 IMPLANT
GLOVE SURG SYN 7.0 (GLOVE) ×3 IMPLANT
GLOVE SURG SYN 7.0 PF PI (GLOVE) ×1 IMPLANT
GLOVE SURG SYN 7.5  E (GLOVE) ×2
GLOVE SURG SYN 7.5 E (GLOVE) ×1 IMPLANT
GLOVE SURG SYN 7.5 PF PI (GLOVE) ×1 IMPLANT
GOWN STRL REUS W/ TWL LRG LVL3 (GOWN DISPOSABLE) ×3 IMPLANT
GOWN STRL REUS W/TWL LRG LVL3 (GOWN DISPOSABLE) ×9
GRASPER SUT TROCAR 14GX15 (MISCELLANEOUS) ×2 IMPLANT
IRRIGATION STRYKERFLOW (MISCELLANEOUS) IMPLANT
IRRIGATOR STRYKERFLOW (MISCELLANEOUS) ×3
IV CATH ANGIO 12GX3 LT BLUE (NEEDLE) ×3 IMPLANT
IV NS 1000ML (IV SOLUTION) ×3
IV NS 1000ML BAXH (IV SOLUTION) IMPLANT
JACKSON PRATT 10 (INSTRUMENTS) IMPLANT
L-HOOK LAP DISP 36CM (ELECTROSURGICAL) ×3
LABEL OR SOLS (LABEL) ×3 IMPLANT
LHOOK LAP DISP 36CM (ELECTROSURGICAL) ×1 IMPLANT
NEEDLE HYPO 22GX1.5 SAFETY (NEEDLE) ×6 IMPLANT
PACK LAP CHOLECYSTECTOMY (MISCELLANEOUS) ×3 IMPLANT
PENCIL ELECTRO HAND CTR (MISCELLANEOUS) ×3 IMPLANT
POUCH SPECIMEN RETRIEVAL 10MM (ENDOMECHANICALS) ×3 IMPLANT
SCISSORS METZENBAUM CVD 33 (INSTRUMENTS) ×3 IMPLANT
SET TUBE SMOKE EVAC HIGH FLOW (TUBING) ×3 IMPLANT
SLEEVE ADV FIXATION 5X100MM (TROCAR) ×9 IMPLANT
SPONGE VERSALON 4X4 4PLY (MISCELLANEOUS) IMPLANT
SUT MNCRL 4-0 (SUTURE) ×3
SUT MNCRL 4-0 27XMFL (SUTURE) ×1
SUT VIC AB 3-0 SH 27 (SUTURE) ×3
SUT VIC AB 3-0 SH 27X BRD (SUTURE) IMPLANT
SUT VICRYL 0 AB UR-6 (SUTURE) ×5 IMPLANT
SUT VICRYL 0 TIES 12 18 (SUTURE) ×2 IMPLANT
SUTURE MNCRL 4-0 27XMF (SUTURE) ×1 IMPLANT
TROCAR BALLN GELPORT 12X130M (ENDOMECHANICALS) ×3 IMPLANT
TROCAR Z-THREAD OPTICAL 5X100M (TROCAR) ×3 IMPLANT

## 2018-06-03 NOTE — Consult Note (Addendum)
[facility] SURGICAL HISTORY AND PHYSCIAL  - cpt: [id number]   HISTORY OF PRESENT ILLNESS (HPI):  43 y.o. female [facility] ED today for evaluation of abdominal pain. Patient reports a 2 days history of worsening RUQ abdominal pain which she described as a sharp cramping pain. Nothing seems to bring the pain on. She tried PPI without improvement. She endorses associated nausea and emesis with the pain. Her PO intake is poor and she last ate around 1900 last night. No complaints of fever, chills, chest pain, SOB, bowel changes, bladder changes, or leg pain. She endorses a history of similar pain multiple times in the past but those episodes always spontaneously resolved. No previous abdominal surgeries. Since coming to the ED her pain has improved some with pain medications. Work up in the ED was concerning for mild leukocytosis and cholelithiasis without sonographic evidence of cholecystitis.   Surgery is consulted by emergency medicine physician Dr. Cherene JulianJonathon Williams, MD in this context for evaluation and management of symptomatic cholelithiasis.   PAST MEDICAL HISTORY (PMH):  Past Medical History:  Diagnosis Date  . Fibrocystic breast changes, left 09/03/2017  . Kidney stones      PAST SURGICAL HISTORY (PSH):  History reviewed. No pertinent surgical history.   MEDICATIONS:  Prior to Admission medications   Medication Sig Start Date End Date Taking? Authorizing Provider  ibuprofen (ADVIL,MOTRIN) 600 MG tablet Take 1 tablet (600 mg total) by mouth every 8 (eight) hours as needed. 12/01/17  Yes Merrily Brittleifenbark, Neil, MD  omeprazole (PRILOSEC) 20 MG capsule Take 1 capsule (20 mg total) by mouth 2 (two) times daily before a meal. Patient taking differently: Take 20 mg by mouth daily as needed (reflux symptoms).  09/02/17 06/03/18 Yes Vanga, Loel Dubonnetohini Reddy, MD  ondansetron (ZOFRAN ODT) 4 MG disintegrating tablet Take 1 tablet (4 mg total) by mouth every 8 (eight) hours as needed for nausea  or vomiting. 12/01/17  Yes Merrily Brittleifenbark, Neil, MD  HYDROcodone-acetaminophen (NORCO) 5-325 MG tablet Take 1 tablet by mouth every 6 (six) hours as needed for up to 7 doses for severe pain. 12/01/17   Merrily Brittleifenbark, Neil, MD  ketorolac (TORADOL) 10 MG tablet Take 1 tablet (10 mg total) by mouth every 8 (eight) hours as needed for severe pain. Patient not taking: Reported on 09/02/2017 05/19/15   Phineas SemenGoodman, Graydon, MD  tamsulosin (FLOMAX) 0.4 MG CAPS capsule Take 1 capsule (0.4 mg total) by mouth daily. 12/01/17   Merrily Brittleifenbark, Neil, MD  tranexamic acid (LYSTEDA) 650 MG TABS tablet Take 2 tablets (1,300 mg total) by mouth 3 (three) times daily. Take during menses for a maximum of five days 09/13/17   Hildred Laserherry, Anika, MD     ALLERGIES:  Allergies  Allergen Reactions  . Erythromycin Hives, Swelling and Rash     SOCIAL HISTORY:  Social History   Socioeconomic History  . Marital status: Married    Spouse name: Not on file  . Number of children: Not on file  . Years of education: Not on file  . Highest education level: Not on file  Occupational History  . Not on file  Social Needs  . Financial resource strain: Not on file  . Food insecurity:    Worry: Not on file    Inability: Not on file  . Transportation needs:    Medical: Not on file    Non-medical: Not on file  Tobacco Use  . Smoking status: Current Every Day Smoker    Packs/day: 0.50  . Smokeless tobacco:  Never Used  Substance and Sexual Activity  . Alcohol use: No  . Drug use: No  . Sexual activity: Yes    Partners: Male    Birth control/protection: None  Lifestyle  . Physical activity:    Days per week: Not on file    Minutes per session: Not on file  . Stress: Not on file  Relationships  . Social connections:    Talks on phone: Not on file    Gets together: Not on file    Attends religious service: Not on file    Active member of club or organization: Not on file    Attends meetings of clubs or organizations: Not on file     Relationship status: Not on file  . Intimate partner violence:    Fear of current or ex partner: Not on file    Emotionally abused: Not on file    Physically abused: Not on file    Forced sexual activity: Not on file  Other Topics Concern  . Not on file  Social History Narrative  . Not on file     FAMILY HISTORY:  Family History  Problem Relation Age of Onset  . Asthma Mother   . Stroke Mother       REVIEW OF SYSTEMS:  Review of Systems  Constitutional: Negative for chills and fever.  HENT: Negative for congestion and sinus pain.   Respiratory: Negative for cough and shortness of breath.   Cardiovascular: Negative for chest pain and palpitations.  Gastrointestinal: Positive for abdominal pain, nausea and vomiting. Negative for blood in stool, constipation and diarrhea.  Genitourinary: Negative for dysuria, hematuria and urgency.  Neurological: Negative for dizziness and headaches.  All other systems reviewed and are negative.   VITAL SIGNS:  Temp:  [98.3 F (36.8 C)] 98.3 F (36.8 C) (02/07 0602) Pulse Rate:  [72-86] 79 (02/07 1145) Resp:  [18] 18 (02/07 0602) BP: (106-146)/(64-91) 113/80 (02/07 1145) SpO2:  [97 %-99 %] 99 % (02/07 1145) Weight:  [76.7 kg] 76.7 kg (02/07 0552)     Height: 5\' 3"  (160 cm) Weight: 76.7 kg BMI (Calculated): 29.94   INTAKE/OUTPUT:  This shift: No intake/output data recorded.  Last 2 shifts: @IOLAST2SHIFTS @   PHYSICAL EXAM:  Physical Exam Constitutional:      General: She is not in acute distress.    Appearance: She is well-developed. She is obese. She is not ill-appearing.  HENT:     Head: Normocephalic and atraumatic.  Eyes:     General: No scleral icterus.    Extraocular Movements: Extraocular movements intact.  Cardiovascular:     Rate and Rhythm: Normal rate and regular rhythm.     Heart sounds: Normal heart sounds. No murmur. No friction rub. No gallop.   Pulmonary:     Effort: Pulmonary effort is normal. No respiratory  distress.     Breath sounds: Normal breath sounds. No wheezing or rhonchi.  Abdominal:     General: Abdomen is protuberant. There is no distension.     Palpations: Abdomen is soft.     Tenderness: There is abdominal tenderness in the right upper quadrant. There is no guarding or rebound.  Genitourinary:    Comments: Deferred Skin:    General: Skin is warm and dry.     Coloration: Skin is not jaundiced or pale.  Neurological:     General: No focal deficit present.     Mental Status: She is alert and oriented to person, place, and time.  Psychiatric:        Mood and Affect: Mood normal.        Behavior: Behavior normal.       Labs:  CBC Latest Ref Rng & Units 06/03/2018 08/25/2017 01/30/2016  WBC 4.0 - 10.5 K/uL 13.1(H) 8.4 9.3  Hemoglobin 12.0 - 15.0 g/dL 52.8 41.3 24.4  Hematocrit 36.0 - 46.0 % 44.1 44.5 44.9  Platelets 150 - 400 K/uL 335 310 301   CMP Latest Ref Rng & Units 06/03/2018 08/25/2017 05/19/2015  Glucose 70 - 99 mg/dL 010(U) 92 725(D)  BUN 6 - 20 mg/dL 9 12 12   Creatinine 0.44 - 1.00 mg/dL 6.64 4.03 4.74  Sodium 135 - 145 mmol/L 139 140 138  Potassium 3.5 - 5.1 mmol/L 3.9 4.7 4.4  Chloride 98 - 111 mmol/L 106 103 107  CO2 22 - 32 mmol/L 27 22 21(L)  Calcium 8.9 - 10.3 mg/dL 9.3 9.2 9.2  Total Protein 6.5 - 8.1 g/dL 7.1 7.1 7.5  Total Bilirubin 0.3 - 1.2 mg/dL 0.4 0.4 0.7  Alkaline Phos 38 - 126 U/L 84 87 75  AST 15 - 41 U/L 15 14 24   ALT 0 - 44 U/L 15 14 19      Imaging studies:   RUQ [place] (06/03/2018) personally reviewed and radiologist report reviewed:  IMPRESSION: Gallstones. Sludge. No evidence of cholecystitis. No biliary distention.   Assessment/Plan: (ICD-10's: K67.20) 43 y.o. female with mild leukocytosis and somewhat improved RUQ abdominal pain likely attributable to symptomatic cholelithiasis without cholecystitis, complicated by pertinent comorbidities including obesity and current tobacco abuse (smoking).   - Admit to general surgery  - NPO, IVF,  IV ABx (Zosyn)  - Pain control as needed (minimize narcotics), antiemetics prn  - Will plan for laparoscopic cholecystectomy pending OR/Anesthsia availability.   - All risks, benefits, and alternatives to above procedure were discussed with the patient and her family, all of their questions were answered to their expressed satisfaction, patient expresses she wishes to proceed, and informed consent was obtained.  - mobilization encouraged    - Medical management of comorbidities   All of the above findings and recommendations were discussed with the patient, and all of patient's questions were answered to her expressed satisfaction.  Thank you for the opportunity to participate in this patient's care.   -- Lynden Oxford, PA-C [facility] 06/03/2018, 12:13 PM [phone] M-F: 7am - 4pm

## 2018-06-03 NOTE — Anesthesia Procedure Notes (Signed)
Procedure Name: Intubation Performed by: Ancil Dewan, CRNA Pre-anesthesia Checklist: Patient identified, Patient being monitored, Timeout performed, Emergency Drugs available and Suction available Patient Re-evaluated:Patient Re-evaluated prior to induction Oxygen Delivery Method: Circle system utilized Preoxygenation: Pre-oxygenation with 100% oxygen Induction Type: IV induction and Rapid sequence Laryngoscope Size: Miller and 2 Grade View: Grade I Tube type: Oral Tube size: 7.0 mm Number of attempts: 1 Airway Equipment and Method: Stylet Placement Confirmation: ETT inserted through vocal cords under direct vision,  positive ETCO2 and breath sounds checked- equal and bilateral Secured at: 21 cm Tube secured with: Tape Dental Injury: Teeth and Oropharynx as per pre-operative assessment        

## 2018-06-03 NOTE — Transfer of Care (Signed)
Immediate Anesthesia Transfer of Care Note  Patient: Kristi Ibarra  Procedure(s) Performed: LAPAROSCOPIC CHOLECYSTECTOMY NO INTRAOPERATIVE CHOLANGIOGRAM (N/A Abdomen)  Patient Location: PACU  Anesthesia Type:General  Level of Consciousness: awake, alert  and oriented  Airway & Oxygen Therapy: Patient Spontanous Breathing and Patient connected to nasal cannula oxygen  Post-op Assessment: Report given to RN and Post -op Vital signs reviewed and stable  Post vital signs: Reviewed and stable  Last Vitals:  Vitals Value Taken Time  BP    Temp    Pulse    Resp    SpO2      Last Pain:  Vitals:   06/03/18 1454  TempSrc: Temporal  PainSc: 0-No pain         Complications: No apparent anesthesia complications

## 2018-06-03 NOTE — Anesthesia Preprocedure Evaluation (Addendum)
Anesthesia Evaluation  Patient identified by MRN, date of birth, ID band Patient awake    Reviewed: Allergy & Precautions, NPO status , Patient's Chart, lab work & pertinent test results  History of Anesthesia Complications Negative for: history of anesthetic complications  Airway Mallampati: II       Dental   Pulmonary neg sleep apnea, neg COPD, Current Smoker,           Cardiovascular (-) hypertension(-) Past MI and (-) CHF (-) dysrhythmias (-) Valvular Problems/Murmurs     Neuro/Psych    GI/Hepatic Neg liver ROS, GERD  Medicated and Controlled,  Endo/Other  neg diabetes  Renal/GU      Musculoskeletal   Abdominal   Peds  Hematology   Anesthesia Other Findings   Reproductive/Obstetrics                            Anesthesia Physical Anesthesia Plan  ASA: II  Anesthesia Plan: General   Post-op Pain Management:    Induction: Intravenous  PONV Risk Score and Plan: 2 and Ondansetron and Dexamethasone  Airway Management Planned: Oral ETT  Additional Equipment:   Intra-op Plan:   Post-operative Plan:   Informed Consent: I have reviewed the patients History and Physical, chart, labs and discussed the procedure including the risks, benefits and alternatives for the proposed anesthesia with the patient or authorized representative who has indicated his/her understanding and acceptance.       Plan Discussed with:   Anesthesia Plan Comments:         Anesthesia Quick Evaluation

## 2018-06-03 NOTE — Anesthesia Postprocedure Evaluation (Signed)
Anesthesia Post Note  Patient: Kristi Ibarra  Procedure(s) Performed: LAPAROSCOPIC CHOLECYSTECTOMY NO INTRAOPERATIVE CHOLANGIOGRAM (N/A Abdomen)  Patient location during evaluation: PACU Anesthesia Type: General Level of consciousness: awake and alert Pain management: pain level controlled Vital Signs Assessment: post-procedure vital signs reviewed and stable Respiratory status: spontaneous breathing and respiratory function stable Cardiovascular status: stable Anesthetic complications: no     Last Vitals:  Vitals:   06/03/18 1925 06/03/18 1930  BP: (!) 127/112 134/90  Pulse: 87 93  Resp: 15 (!) 23  Temp:    SpO2: 96% 94%    Last Pain:  Vitals:   06/03/18 1930  TempSrc:   PainSc: 7                  KEPHART,WILLIAM K

## 2018-06-03 NOTE — Anesthesia Post-op Follow-up Note (Signed)
Anesthesia QCDR form completed.        

## 2018-06-03 NOTE — Op Note (Signed)
  Procedure Date:  06/03/2018  Pre-operative Diagnosis:   Acute cholecystitis  Post-operative Diagnosis:  Acute cholecystitis  Procedure:  Laparoscopic cholecystectomy  Surgeon:  Howie Ill, MD  Anesthesia:  General endotracheal  Estimated Blood Loss:  25 ml  Specimens:  gallbladder  Complications:  None  Indications for Procedure:  This is a 43 y.o. female who presents with abdominal pain and workup revealing acute cholecystitis.  The benefits, complications, treatment options, and expected outcomes were discussed with the patient. The risks of bleeding, infection, recurrence of symptoms, failure to resolve symptoms, bile duct damage, bile duct leak, retained common bile duct stone, bowel injury, and need for further procedures were all discussed with the patient and she was willing to proceed.  Description of Procedure: The patient was correctly identified in the preoperative area and brought into the operating room.  The patient was placed supine with VTE prophylaxis in place.  Appropriate time-outs were performed.  Anesthesia was induced and the patient was intubated.  Appropriate antibiotics were infused.  The abdomen was prepped and draped in a sterile fashion. An infraumbilical incision was made. A cutdown technique was used to enter the abdominal cavity without injury, and a Hasson trocar was inserted.  Pneumoperitoneum was obtained with appropriate opening pressures.  A 5-mm port was placed in the subxiphoid area and two 5-mm ports were placed in the right upper quadrant under direct visualization.  The gallbladder was identified.  It was very distended and needed to be decompressed with laparoscopic needle.  The fundus was grasped and retracted cephalad.  Adhesions were lysed bluntly and with electrocautery. The infundibulum was grasped and retracted laterally, exposing the peritoneum overlying the gallbladder.  This was incised with electrocautery and extended on either side  of the gallbladder.  The cystic duct and cystic artery were clearly identified and bluntly dissected.  The cystic duct was very dilated and a 5 mm clip applier would be too small, so the subxiphoid incision was extended and a 12 mm port was placed.  Both were clipped twice proximally and once distally, cutting in between.  There was mild bleeding from a small arterial branch off the cystic duct that was clipped once without complications.  The gallbladder was taken from the gallbladder fossa in a retrograde fashion with electrocautery. The gallbladder was placed in an Endocatch bag. The liver bed was inspected and any bleeding was controlled with electrocautery. The right upper quadrant was then inspected again revealing intact clips, no bleeding, and no ductal injury.  The area was thoroughly irrigated.  The 12 mm subxiphoid port was removed and the fascia was closed with 0 Vicryl suture and the PMI.  The 5 mm ports were removed under direct visualization and the Hasson trocar was removed.  The Endocatch bag was brought out via the umbilical incision. The fascial opening was closed using 0 vicryl suture x 2.  Local anesthetic was infused in all incisions.  The two larger incisions were closed in two layers with 3-0 Vicryl and 4-0 Monocryl and the smaller incisions were closed with 4-0 Monocryl.  The wounds were cleaned and sealed with DermaBond.  The patient was emerged from anesthesia and extubated and brought to the recovery room for further management.  The patient tolerated the procedure well and all counts were correct at the end of the case.   Howie Ill, MD

## 2018-06-03 NOTE — ED Triage Notes (Signed)
Patient ambulatory to triage with steady gait, without difficulty or distress noted; pt reports right upper abd pain radiating into back accomp by nausea since yesterday; st hx of same but never examined

## 2018-06-03 NOTE — ED Provider Notes (Signed)
[facility] Emergency Department Provider Note       Time seen: ----------------------------------------- 8:20 AM on 06/03/2018 -----------------------------------------   I have reviewed the triage vital signs and the nursing notes.  HISTORY   Chief Complaint Abdominal Pain    HPI Kristi Ibarra is a 43 y.o. female with a history of kidney stones who presents to the ED for right upper quadrant and epigastric pain that radiates into her back accompanied by nausea since yesterday.  Over the past 2 days symptoms have been much worse.  She has had intermittent pain in the past.  She does take omeprazole for GERD.  She denies fevers but has felt hot and cold.  Past Medical History:  Diagnosis Date  . Fibrocystic breast changes, left 09/03/2017  . Kidney stones     Patient Active Problem List   Diagnosis Date Noted  . Fibrocystic breast changes, left 09/03/2017  . DUB (dysfunctional uterine bleeding) 02/06/2016    History reviewed. No pertinent surgical history.  Allergies Erythromycin  Social History Social History   Tobacco Use  . Smoking status: Current Every Day Smoker    Packs/day: 0.50  . Smokeless tobacco: Never Used  Substance Use Topics  . Alcohol use: No  . Drug use: No   Review of Systems Constitutional: Negative for fever. Cardiovascular: Negative for chest pain. Respiratory: Negative for shortness of breath. Gastrointestinal: Positive for abdominal pain, nausea and vomiting Musculoskeletal: Positive for back pain Skin: Negative for rash. Neurological: Negative for headaches, focal weakness or numbness.  All systems negative/normal/unremarkable except as stated in the HPI  ____________________________________________   PHYSICAL EXAM:  VITAL SIGNS: ED Triage Vitals  Enc Vitals Group     BP 06/03/18 0602 127/89     Pulse Rate 06/03/18 0602 86     Resp 06/03/18 0602 18     Temp 06/03/18 0602 98.3 F (36.8 C)     Temp  Source 06/03/18 0602 Oral     SpO2 06/03/18 0602 97 %     Weight 06/03/18 0552 169 lb (76.7 kg)     Height 06/03/18 0552 5\' 3"  (1.6 m)     Head Circumference --      Peak Flow --      Pain Score 06/03/18 0552 8     Pain Loc --      Pain Edu? --      Excl. in GC? --    Constitutional: Alert and oriented. Well appearing and in no distress. Eyes: Conjunctivae are normal. Normal extraocular movements. Cardiovascular: Normal rate, regular rhythm. No murmurs, rubs, or gallops. Respiratory: Normal respiratory effort without tachypnea nor retractions. Breath sounds are clear and equal bilaterally. No wheezes/rales/rhonchi. Gastrointestinal: Right upper quadrant tenderness, no rebound or guarding.  Normal bowel sounds.  Positive Murphy sign Musculoskeletal: Nontender with normal range of motion in extremities. No lower extremity tenderness nor edema. Neurologic:  Normal speech and language. No gross focal neurologic deficits are appreciated.  Skin:  Skin is warm, dry and intact. No rash noted. Psychiatric: Mood and affect are normal. Speech and behavior are normal.  ____________________________________________  EKG: Interpreted by me.  Sinus rhythm with a rate of 79 bpm, normal PR interval, normal QRS, normal QT  ____________________________________________  ED COURSE:  As part of my medical decision making, I reviewed the following data within the electronic MEDICAL RECORD NUMBER History obtained from family if available, nursing notes, old chart and ekg, as well as notes from prior ED visits. Patient presented for  right upper quadrant pain, we will assess with labs and imaging as indicated at this time.   Procedures ____________________________________________   LABS (pertinent positives/negatives)  Labs Reviewed  CBC WITH DIFFERENTIAL/PLATELET - Abnormal; Notable for the following components:      Result Value   WBC 13.1 (*)    Neutro Abs 9.0 (*)    All other components within normal  limits  COMPREHENSIVE METABOLIC PANEL - Abnormal; Notable for the following components:   Glucose, Bld 137 (*)    All other components within normal limits  URINALYSIS, COMPLETE (UACMP) WITH MICROSCOPIC - Abnormal; Notable for the following components:   Color, Urine YELLOW (*)    APPearance HAZY (*)    Hgb urine dipstick MODERATE (*)    Ketones, ur 5 (*)    Leukocytes, UA TRACE (*)    All other components within normal limits  LIPASE, BLOOD  TROPONIN I  PREGNANCY, URINE    RADIOLOGY Images were viewed by me  Right upper quadrant ultrasound IMPRESSION: Gallstones. Sludge. No evidence of cholecystitis. No biliary distention. ____________________________________________   DIFFERENTIAL DIAGNOSIS   GERD, peptic ulcer disease, cholecystitis, biliary colic  FINAL ASSESSMENT AND PLAN  Biliary colic, possible cholecystitis   Plan: The patient had presented for right upper quadrant pain. Patient's labs did indicate leukocytosis. Patient's imaging did reveal gallstones and sludge but no obvious cholecystitis.  She remains remarkably tender over the right upper quadrant.  I will discuss with general surgery for possible cholecystectomy.   Ulice Dash, MD    Note: This note was generated in part or whole with voice recognition software. Voice recognition is usually quite accurate but there are transcription errors that can and very often do occur. I apologize for any typographical errors that were not detected and corrected.     Emily Filbert, MD 06/03/18 [phone]

## 2018-06-04 ENCOUNTER — Encounter: Payer: Self-pay | Admitting: Surgery

## 2018-06-04 LAB — CBC WITH DIFFERENTIAL/PLATELET
Abs Immature Granulocytes: 0.14 10*3/uL — ABNORMAL HIGH (ref 0.00–0.07)
Basophils Absolute: 0 10*3/uL (ref 0.0–0.1)
Basophils Relative: 0 %
Eosinophils Absolute: 0 10*3/uL (ref 0.0–0.5)
Eosinophils Relative: 0 %
HCT: 38.2 % (ref 36.0–46.0)
HEMOGLOBIN: 12.6 g/dL (ref 12.0–15.0)
Immature Granulocytes: 1 %
LYMPHS PCT: 9 %
Lymphs Abs: 1.7 10*3/uL (ref 0.7–4.0)
MCH: 30.7 pg (ref 26.0–34.0)
MCHC: 33 g/dL (ref 30.0–36.0)
MCV: 92.9 fL (ref 80.0–100.0)
Monocytes Absolute: 0.7 10*3/uL (ref 0.1–1.0)
Monocytes Relative: 4 %
Neutro Abs: 16.7 10*3/uL — ABNORMAL HIGH (ref 1.7–7.7)
Neutrophils Relative %: 86 %
Platelets: 308 10*3/uL (ref 150–400)
RBC: 4.11 MIL/uL (ref 3.87–5.11)
RDW: 13.1 % (ref 11.5–15.5)
WBC: 19.2 10*3/uL — ABNORMAL HIGH (ref 4.0–10.5)
nRBC: 0 % (ref 0.0–0.2)

## 2018-06-04 MED ORDER — IBUPROFEN 600 MG PO TABS: 600.0000 mg | ORAL_TABLET | Freq: Three times a day (TID) | ORAL | 0 refills | Status: DC | PRN

## 2018-06-04 MED ORDER — AMOXICILLIN-POT CLAVULANATE 875-125 MG PO TABS: 1.0000 | ORAL_TABLET | Freq: Two times a day (BID) | ORAL | 0 refills | Status: AC

## 2018-06-04 MED ORDER — OXYCODONE HCL 5 MG PO TABS: 5.0000 mg | ORAL_TABLET | ORAL | 0 refills | Status: DC | PRN

## 2018-06-04 NOTE — Progress Notes (Signed)
06/04/2018  Subjective: Patient is 1 Day Post-Op s/p laparoscopic cholecystectomy.  No acute events overnight.  Patient reports doing well with no significant pain.  Tolerated clear liquids.  Vital signs: Temp:  [97.4 F (36.3 C)-98.4 F (36.9 C)] 98.4 F (36.9 C) (02/08 1147) Pulse Rate:  [73-98] 89 (02/08 1147) Resp:  [9-23] 18 (02/08 1147) BP: (87-134)/(56-112) 101/64 (02/08 1147) SpO2:  [90 %-100 %] 98 % (02/08 1147) Weight:  [76.7 kg] 76.7 kg (02/07 1454)   Intake/Output: 02/07 0701 - 02/08 0700 In: 2633.5 [P.O.:780; I.V.:1762.5; IV Piggyback:91.1] Out: 820 [Urine:800; Blood:20]    Physical Exam: Constitutional: No acute distress Abdomen:, Nondistended, appropriately tender to palpation.  Incisions are clean dry and intact with no evidence of infection.  Labs:  Recent Labs    06/03/18 0601 06/04/18 0613  WBC 13.1* 19.2*  HGB 14.5 12.6  HCT 44.1 38.2  PLT 335 308   Recent Labs    06/03/18 0601  NA 139  K 3.9  CL 106  CO2 27  GLUCOSE 137*  BUN 9  CREATININE 0.63  CALCIUM 9.3   No results for input(s): LABPROT, INR in the last 72 hours.  Imaging: No results found.  Assessment/Plan: This is a 43 y.o. female s/p laparoscopic cholecystectomy.  -Patient healing well.  Although her WBC did go up today to 19.2, this is likely combination of her infection as well as stress from her surgery.  There is no clinical concern at this point for any potential complication. - Advance diet to regular diet for lunch and may discharge to home later if she tolerates well. -We will need oral antibiotic course for home.   Howie Ill, MD [facility]

## 2018-06-04 NOTE — Progress Notes (Signed)
Patient on 2L continuous O2 at the start of the shift. O2 sats steady around 95%. Incentive spirometry encouraged. Patient reached 1000 with IS. Bilateral breath sounds slightly diminished. Unlabored, equal. Patient is also a smoker. O2 discontinue around noon. Patient O2 sats sround 95%. Patient ambulated in hall. Tolerated well. O2 sats after ambulation 96%. No complaints of pain other than gas. Patient states she has passed some gas. Any pain patient has is controlled with toradol and tylenol.GI assessment WDL. 4 incision sites WDL. Voiding adequately. Patient tolerated clear liquid diet with no nausea or vomiting. Patient to eat regular diet for lunch. Provider updated on patient progress. RN to continue to monitor.

## 2018-06-04 NOTE — Discharge Summary (Signed)
Patient ID: Kristi Ibarra MRN: [id number] DOB/AGE: 26-Aug-1975 43 y.o.  Admit date: 06/03/2018 Discharge date: 06/04/2018   Discharge Diagnoses:  Active Problems:   Cholelithiasis   Acute cholecystitis   Procedures:  Laparoscopic cholecystectomy  Hospital Course: Patient was admitted on 06/03/2018 with acute cholecystitis and was taken to the operating room that same day for laparoscopic cholecystectomy.  Postoperatively she did well her diet was slowly advanced.  Her pain was well controlled.  She was on IV antibiotics and although her white blood cell count did increase on postop day 1, this is likely due to the stress of surgery and inflammation rather than worsening condition.  Clinically she has been doing very well.  Will be discharged to home today with a course of Augmentin for 10 days and a follow-up in 2 weeks with me.  Consults: None  Disposition: Discharge disposition: 01-Home or Self Care       Discharge Instructions    Call MD for:  difficulty breathing, headache or visual disturbances   Complete by:  As directed    Call MD for:  persistant nausea and vomiting   Complete by:  As directed    Call MD for:  redness, tenderness, or signs of infection (pain, swelling, redness, odor or green/yellow discharge around incision site)   Complete by:  As directed    Call MD for:  severe uncontrolled pain   Complete by:  As directed    Call MD for:  temperature >100.4   Complete by:  As directed    Diet - low sodium heart healthy   Complete by:  As directed    Discharge instructions   Complete by:  As directed    1.  Patient may shower, but do not scrub wounds heavily and dab dry only. 2.  Do not submerge wounds in pool/tub for 1 week. 3.  Do not apply ointments or hydrogen peroxide to the wounds.   Driving Restrictions   Complete by:  As directed    Do not drive while taking narcotics for pain control.   Increase activity slowly   Complete by:  As directed    Lifting  restrictions   Complete by:  As directed    No heavy lifting or pushing of more than 10-15 lbs for 4 weeks.   No dressing needed   Complete by:  As directed      Allergies as of 06/04/2018      Reactions   Erythromycin Hives, Swelling, Rash      Medication List    TAKE these medications   amoxicillin-clavulanate 875-125 MG tablet Commonly known as:  AUGMENTIN Take 1 tablet by mouth 2 (two) times daily for 10 days.   ibuprofen 600 MG tablet Commonly known as:  ADVIL,MOTRIN Take 1 tablet (600 mg total) by mouth every 8 (eight) hours as needed for fever, headache, mild pain or moderate pain. What changed:  reasons to take this   omeprazole 20 MG capsule Commonly known as:  PRILOSEC Take 1 capsule (20 mg total) by mouth 2 (two) times daily before a meal. What changed:    when to take this  reasons to take this   ondansetron 4 MG disintegrating tablet Commonly known as:  ZOFRAN ODT Take 1 tablet (4 mg total) by mouth every 8 (eight) hours as needed for nausea or vomiting.   oxyCODONE 5 MG immediate release tablet Commonly known as:  Oxy IR/ROXICODONE Take 1 tablet (5 mg total) by mouth every 4 (four)  hours as needed for severe pain.   tamsulosin 0.4 MG Caps capsule Commonly known as:  FLOMAX Take 1 capsule (0.4 mg total) by mouth daily.      Follow-up Information    Avereigh Spainhower, Elita Quick, MD Follow up in 2 week(s).   Specialty:  General Surgery Contact information: [address] [place] [address] [phone]

## 2018-06-04 NOTE — Progress Notes (Signed)
Patient tolerated regular diet for lunch. Ate about 50% of food. No complaints of nausea or vomiting. Patient states she is ready for discharge. Provider, Dr. Aleen CampiPiscoya, updated. IV removed. Discharge instructions discussed with patient including where to pick up e-scripts. Patient and husband verbalize understanding of teaching. Patient discharged home via wheelchair pushed by staff.

## 2018-06-04 NOTE — Plan of Care (Signed)
Patient's vital signs stable; IV fluids continue; IV site clear and soft; pain controlled with Toradol IV and Tylenol po; Zofran IV given once for nausea; tolerating po fluids now and saltine crackers; voiding; husband at bedside and attentive; nurse instructed patient on s/s incision infection as plans for discharge.

## 2018-06-06 ENCOUNTER — Telehealth: Payer: Self-pay | Admitting: *Deleted

## 2018-06-06 NOTE — Telephone Encounter (Signed)
Patient needs a work note saying that she needs be out of work for a week, she had gallbladder removal with Dr.Piscoya on 06/03/18

## 2018-06-06 NOTE — Telephone Encounter (Signed)
Needs letter to return to work on 06/13/2018. She will come by and pick it up this week. letter up up front.

## 2018-06-07 LAB — SURGICAL PATHOLOGY

## 2018-06-07 NOTE — Addendum Note (Signed)
Addendum  created 06/07/18 1137 by Stormy Fabian, CRNA   Charge Capture section accepted

## 2018-06-14 ENCOUNTER — Telehealth: Payer: Self-pay | Admitting: *Deleted

## 2018-06-14 ENCOUNTER — Telehealth: Payer: Self-pay

## 2018-06-14 NOTE — Telephone Encounter (Signed)
Refaxed FMLA and received confirmation .

## 2018-06-14 NOTE — Telephone Encounter (Signed)
FMLA paperwork completed and faxed to [employer] [phone]. Patient notified. Placed in scan folder.

## 2018-06-14 NOTE — Telephone Encounter (Signed)
Patient called and stated that her employer received the FMLA paperwork but not all the pages when through. Please fax back.

## 2018-06-17 ENCOUNTER — Encounter: Payer: Self-pay | Admitting: Surgery

## 2018-06-24 ENCOUNTER — Ambulatory Visit (INDEPENDENT_AMBULATORY_CARE_PROVIDER_SITE_OTHER): Payer: Managed Care, Other (non HMO) | Admitting: Surgery

## 2018-06-24 ENCOUNTER — Other Ambulatory Visit: Payer: Self-pay

## 2018-06-24 ENCOUNTER — Encounter: Payer: Self-pay | Admitting: Surgery

## 2018-06-24 VITALS — BP 128/72 | HR 74 | Temp 97.7°F | Ht 63.0 in | Wt 167.0 lb

## 2018-06-24 DIAGNOSIS — K810 Acute cholecystitis: Secondary | ICD-10-CM

## 2018-06-24 DIAGNOSIS — Z09 Encounter for follow-up examination after completed treatment for conditions other than malignant neoplasm: Secondary | ICD-10-CM

## 2018-06-24 NOTE — Patient Instructions (Signed)
Return as needed.The patient is aware to call back for any questions or concerns.  

## 2018-06-24 NOTE — Progress Notes (Signed)
06/24/2018  HPI: Kristi Ibarra is a 43 y.o. female s/p laparoscopic cholecystectomy on 06/03/17.  Presents for follow up.  Reports that had some nausea and loose stools intially which are getting better.  She also has been having itchiness of skin diffuse.  No significant pain and otherwise tolerating diet.  Vital signs: BP 128/72   Pulse 74   Temp 97.7 F (36.5 C) (Skin)   Ht 5\' 3"  (1.6 m)   Wt 167 lb (75.8 kg)   LMP 05/27/2018 (Exact Date)   SpO2 98%   BMI 29.58 kg/m    Physical Exam: Constitutional: No acute distress, no jaundice. Abdomen:  Soft, non-distended, non-tender to palpation. Incisions clean, dry, intact.  Dermabond peeled off the umbilical and right lateral incision which took scab with it on the right lateral incision, showing a mild 1 mm superficial opening on the epidermis and a suture knot in a corner.  No evidence of infection.  Assessment/Plan: This is a 43 y.o. female s/p laparoscopic cholecystectomy.  --Suture removed without issues.  Bandaid applied to right lateral wound. --Discussed with patient that her symptoms are likely related to her body adjusting to not having a gallbladder.  These should continue to improve.  However, not sure why she would have itchiness.  Her skin is not jaundiced, her sclera are not icteric, and she's not having any abdominal pain.  Recommended that she try Benadryl for itching. --May follow up prn.   Howie Ill, MD [facility]

## 2018-07-11 ENCOUNTER — Encounter: Payer: Self-pay | Admitting: Surgery

## 2019-06-30 IMAGING — US US BREAST*L* LIMITED INC AXILLA
1 series · 4 of 4 positions shown · non-contrast
Comparison: Previous exam(s).

CLINICAL DATA: The patient was called back for a left breast mass

EXAM:
DIGITAL DIAGNOSTIC LEFT MAMMOGRAM WITH CAD AND TOMO
ULTRASOUND LEFT BREAST

[Series 1: us breast*left* limited inc axilla · 0.07mm/px · 4 of 4 slices shown]
[im 1/4]
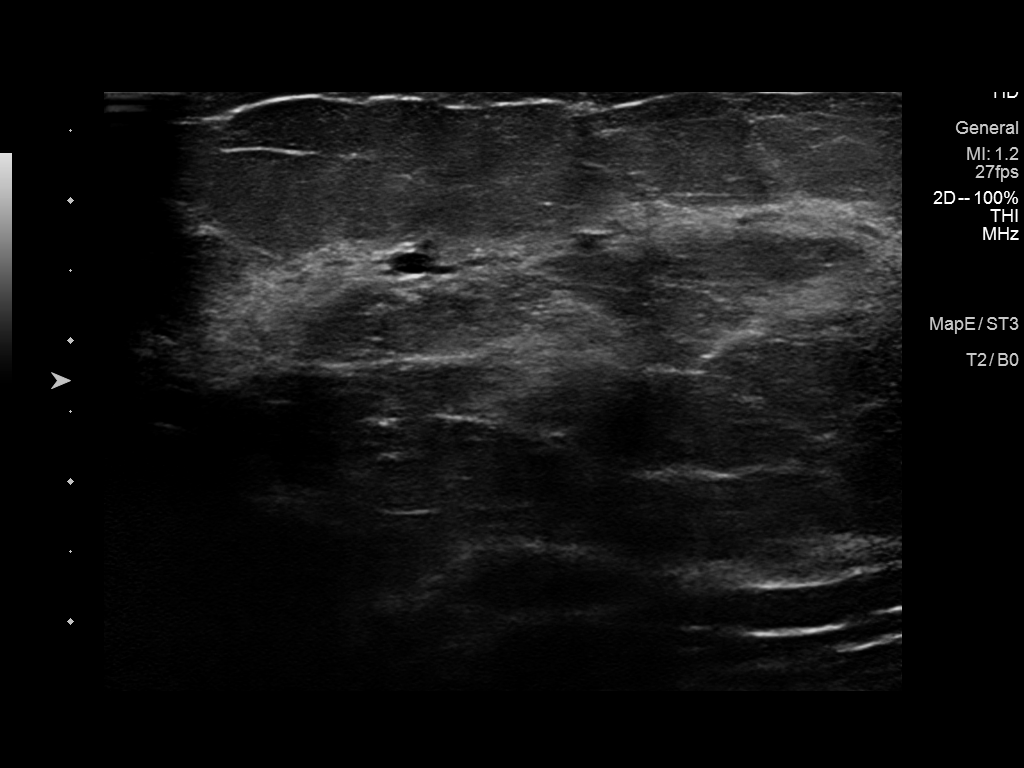
[im 2/4]
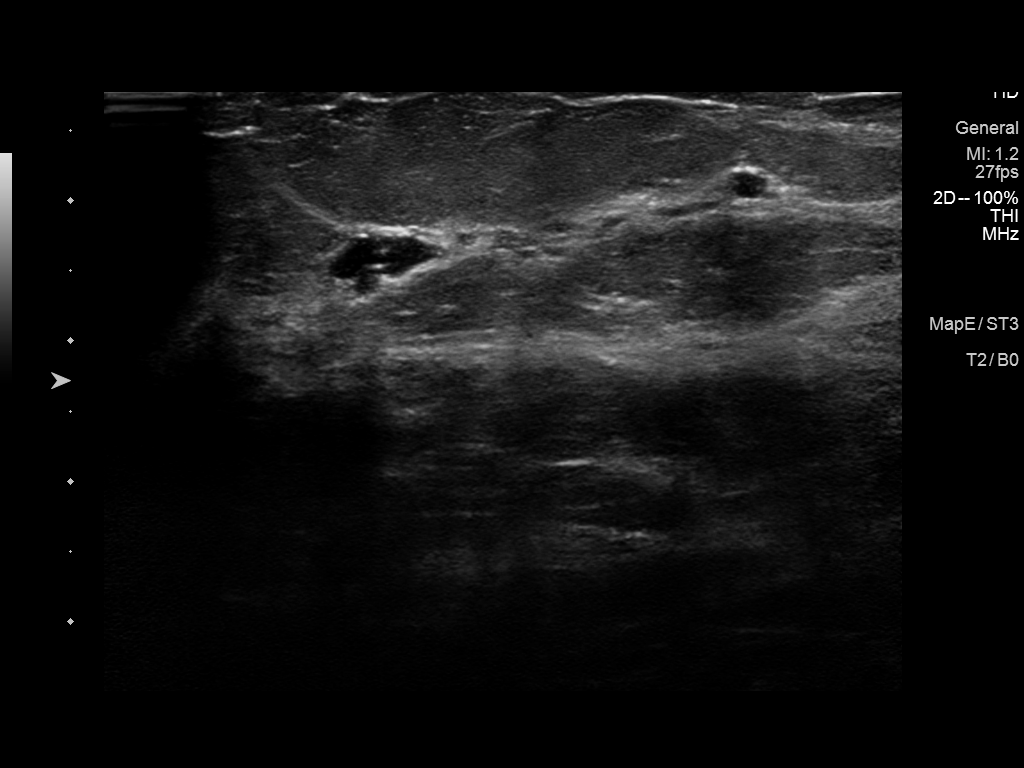
[im 3/4]
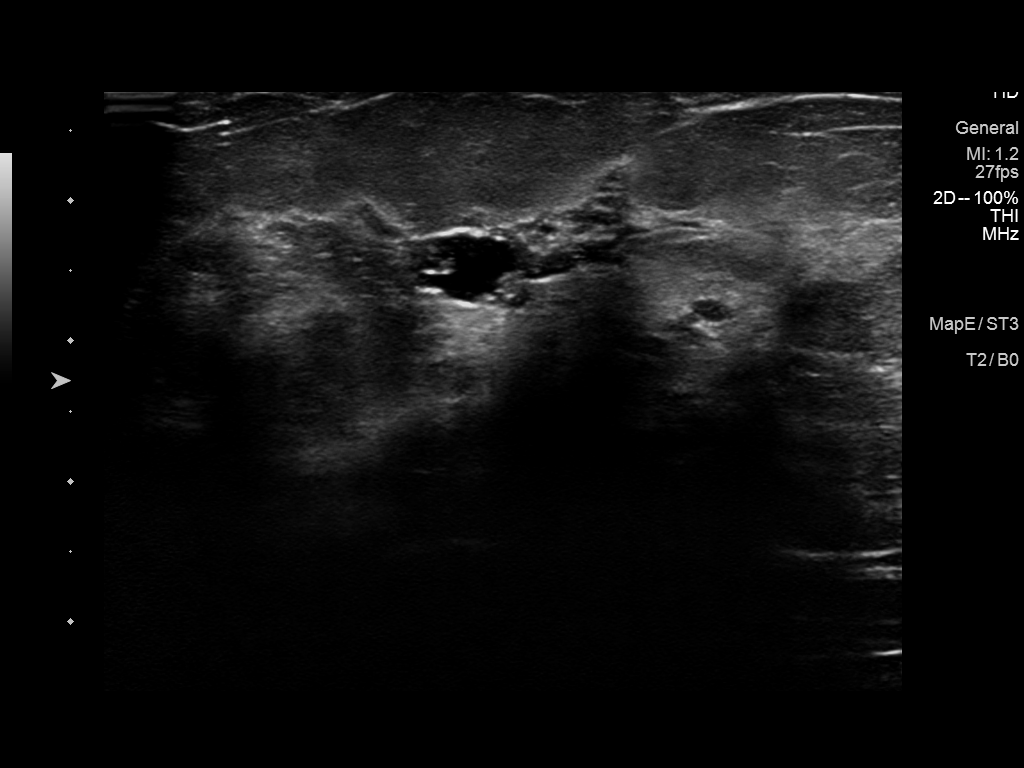
[im 4/4]
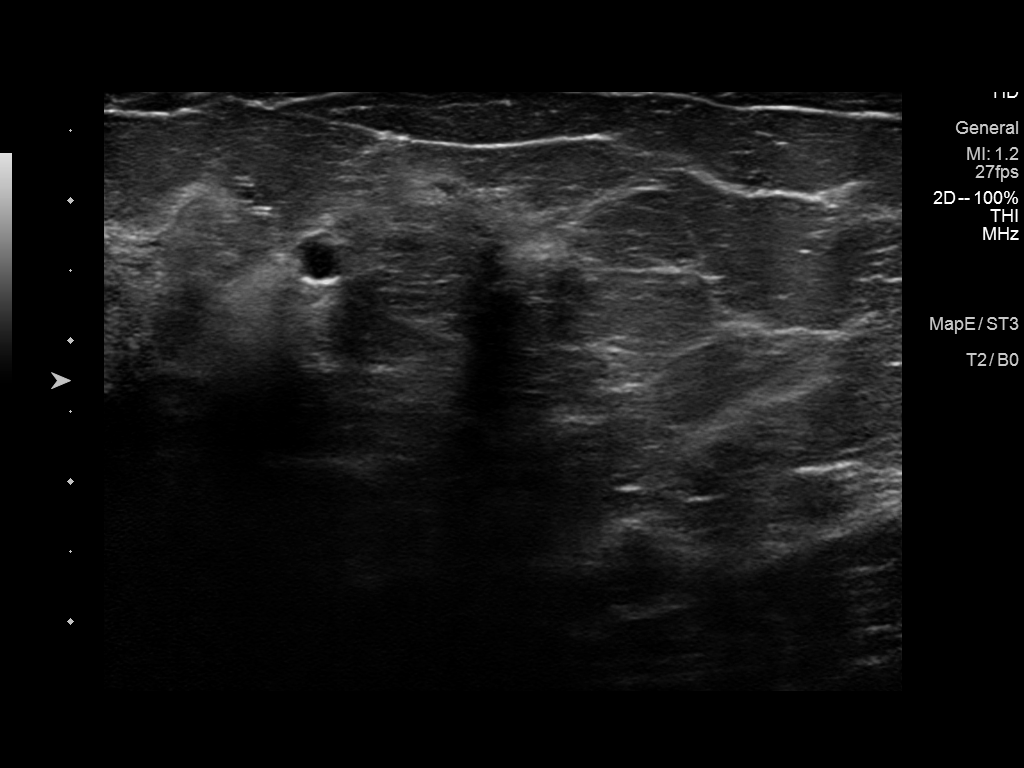

[4 of 4 positions shown; findings below may reference images not displayed]

ACR Breast Density Category b: There are scattered areas of
fibroglandular density.
FINDINGS: Multiple obscured masses are seen in the left breast.

Mammographic images were processed with CAD.

On physical exam, no suspicious lumps are identified.

Targeted ultrasound is performed, showing numerous cysts accounting
for mammographic findings.
IMPRESSION: Fibrocystic changes.  No evidence of malignancy.

RECOMMENDATION:
Annual screening mammography.

I have discussed the findings and recommendations with the patient.
Results were also provided in writing at the conclusion of the
visit. If applicable, a reminder letter will be sent to the patient
regarding the next appointment.

BI-RADS CATEGORY  2: Benign.

## 2019-07-06 ENCOUNTER — Telehealth: Payer: Self-pay | Admitting: Obstetrics and Gynecology

## 2019-07-06 NOTE — Telephone Encounter (Signed)
Called patient regarding her appt request through MyChart. Patient aware of appt made for 09/27/2019.

## 2019-07-06 NOTE — Telephone Encounter (Signed)
Call patient to set up appt for AE. Patient asked if she could have her prescription for norethindrone refilled. Patient hasnt been see since 08/18/2017 but was scheduled for her physical 09/27/2019. Would she be able to get that refilled before her apt or would she need to be seen before then to get a refill?

## 2019-07-07 NOTE — Telephone Encounter (Signed)
Spoke with pt concerning medication refill and informed that she would need a follow up visit with [facility] since her last visit was 2019. Pt stated that she understood and scheduled a televisit with [facility] 07/12/2019 at 4pm.

## 2019-07-12 ENCOUNTER — Other Ambulatory Visit: Payer: Self-pay

## 2019-07-12 ENCOUNTER — Encounter: Payer: Self-pay | Admitting: Obstetrics and Gynecology

## 2019-07-12 ENCOUNTER — Ambulatory Visit (INDEPENDENT_AMBULATORY_CARE_PROVIDER_SITE_OTHER): Payer: 59 | Admitting: Obstetrics and Gynecology

## 2019-07-12 VITALS — BP 114/80 | HR 88 | Ht 63.0 in | Wt 170.0 lb

## 2019-07-12 DIAGNOSIS — N951 Menopausal and female climacteric states: Secondary | ICD-10-CM | POA: Diagnosis not present

## 2019-07-12 DIAGNOSIS — N939 Abnormal uterine and vaginal bleeding, unspecified: Secondary | ICD-10-CM | POA: Diagnosis not present

## 2019-07-12 MED ORDER — NORETHINDRONE ACETATE 5 MG PO TABS: 5.0000 mg | ORAL_TABLET | Freq: Every day | ORAL | 2 refills | Status: DC

## 2019-07-12 NOTE — Progress Notes (Signed)
Virtual Visit via Telephone Note  I connected with Kristi Ibarra on 07/12/19 at  4:00 PM EDT by telephone and verified that I am speaking with the correct person using two identifiers.   I discussed the limitations, risks, security and privacy concerns of performing an evaluation and management service by telephone and the availability of in person appointments. I also discussed with the patient that there may be a patient responsible charge related to this service. The patient expressed understanding and agreed to proceed.   History of Present Illness: Kristi Ibarra is a 44 y.o. G0P0 female who presents for medication refill. Last seen in 2019 for complaints of abnormal menstrual cycles. Had negative workup at that time.  Was treated with Aygestin which normalized her cycles for a while so she discontinued.  Currently she complains that she has been bleeding since January 23rd.  Has only had 1 or 2 days where the bleeding slowed to spotting.  Denies chest pain, SOB. Notes prior to this, her cycle will sometimes skip months.      Observations/Objective: Blood pressure 114/80, pulse 88, height 5\' 3"  (1.6 m), weight 170 lb (77.1 kg), last menstrual period 05/20/2019.   Assessment and Plan:  Abnormal uterine bleeding - likely patient is perimenopausal, has been dealing with irregular and prolonged cycles since 2019.  Discussed that she does need to follow up in clinic for further evaluation as last evaluation was in 2019.  Can also discuss more long-term management options for bleeding. Currently patient desires refill on Aygestin.  Will refill until patient can return for visit.  Has an annual exam in June.  If bleeding worsens despite medication, will need to be seen sooner.    Follow Up Instructions:    I discussed the assessment and treatment plan with the patient. The patient was provided an opportunity to ask questions and all were answered. The patient agreed with the plan and demonstrated an  understanding of the instructions.   The patient was advised to call back or seek an in-person evaluation if the symptoms worsen or if the condition fails to improve as anticipated.  I provided 7 minutes of non-face-to-face time during this encounter.   July, MD

## 2019-07-12 NOTE — Progress Notes (Signed)
Televisit-reviewed and updated documenting. Pt having televisit for medication refill. During conversation pt stated her LMP 05/20/2019 and still bleeding. Pt is requesting a refill of medication to help with the heavy bleeding.

## 2019-09-27 ENCOUNTER — Encounter: Payer: Managed Care, Other (non HMO) | Admitting: Obstetrics and Gynecology

## 2019-09-27 IMAGING — CT CT RENAL STONE PROTOCOL
3 of 4 series · 9 of 46 positions shown, 16 images · non-contrast
Comparison: May 19, 2015

CLINICAL DATA: Left flank and inguinal region pain

EXAM:
CT ABDOMEN AND PELVIS WITHOUT CONTRAST
TECHNIQUE: Multidetector CT imaging of the abdomen and pelvis was performed
following the standard protocol without oral or IV contrast.

[Series 4: lung bases · axial · 0.77mm/px · z∈[-133,-43]mm · 5 of 28 slices shown, 10 images]
[im 5/28  soft-tissue]
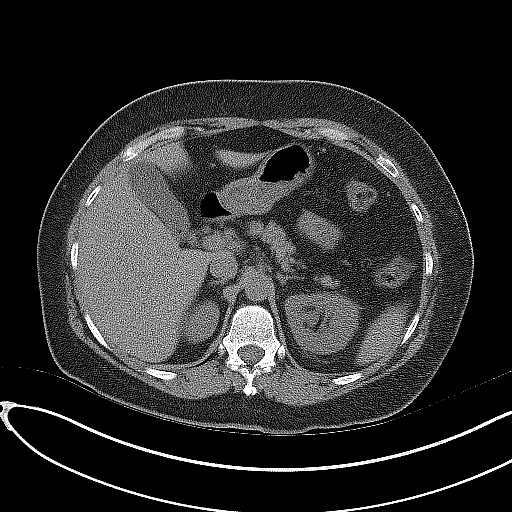
[im 5/28  bone]
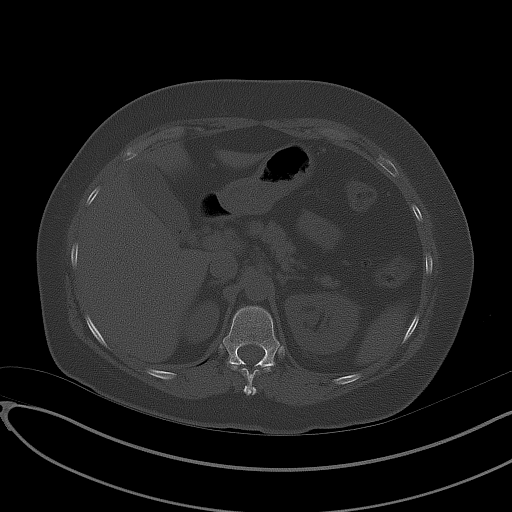
[im 10/28  soft-tissue]
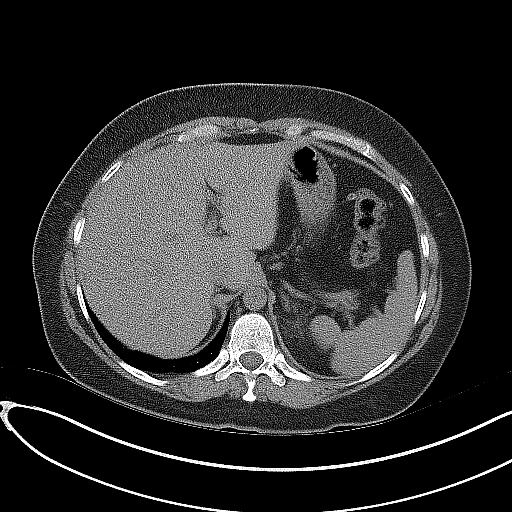
[im 10/28  lung]
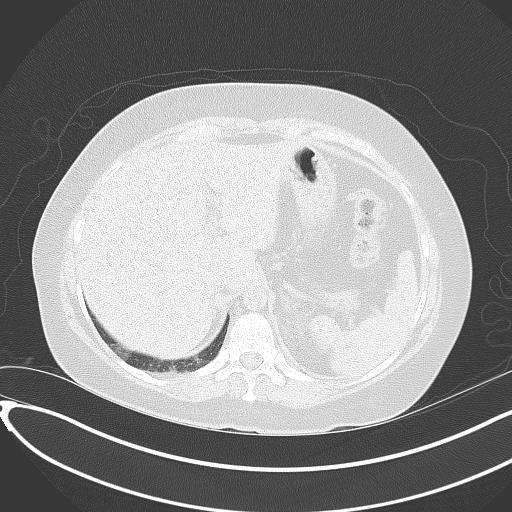
[im 14/28  soft-tissue]
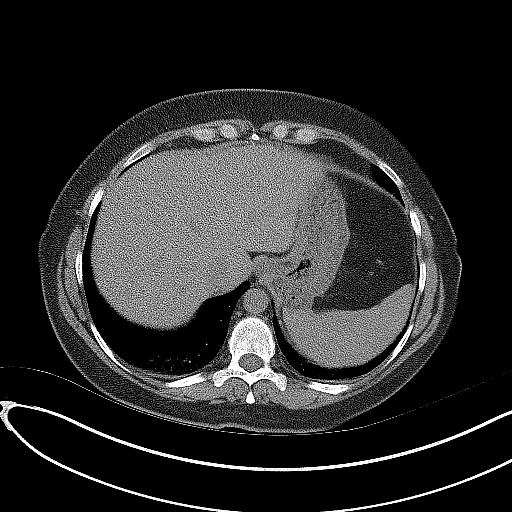
[im 14/28  lung]
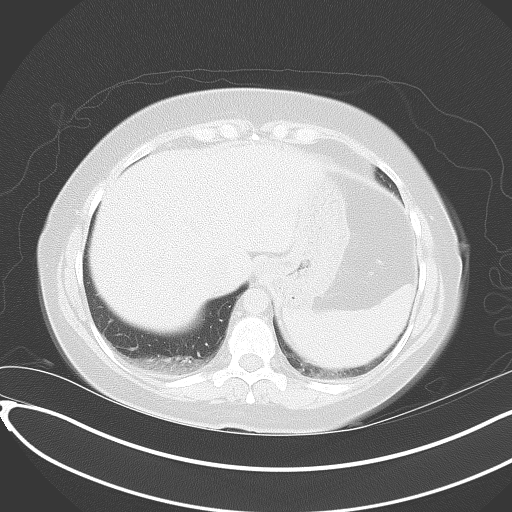
[im 19/28  soft-tissue]
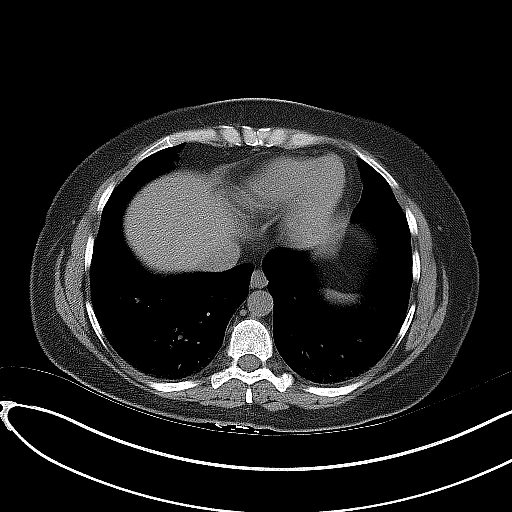
[im 19/28  lung]
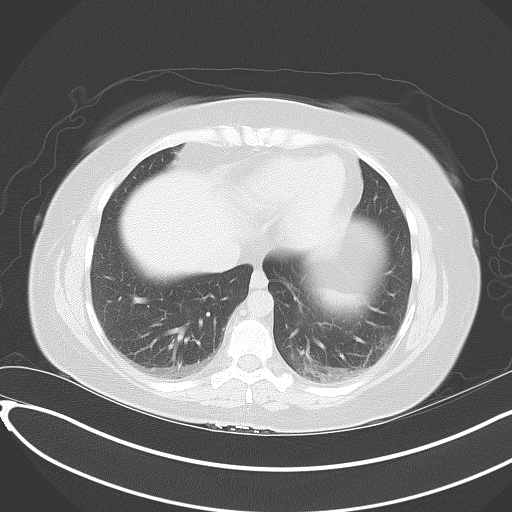
[im 23/28  soft-tissue]
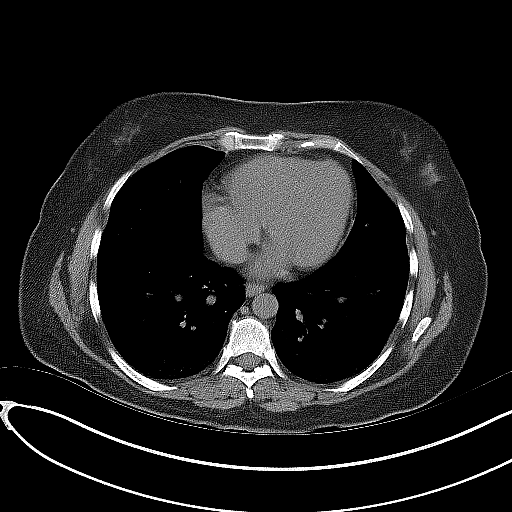
[im 23/28  lung]
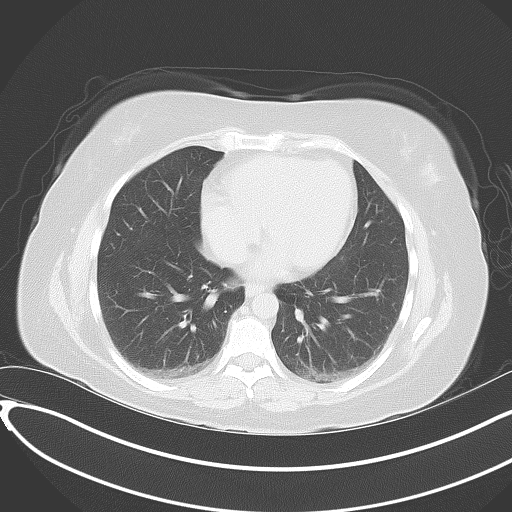

[Series 5: coronal · coronal · 0.76mm/px · 3 of 130 slices shown, 4 images]
[im 44/130  soft-tissue]
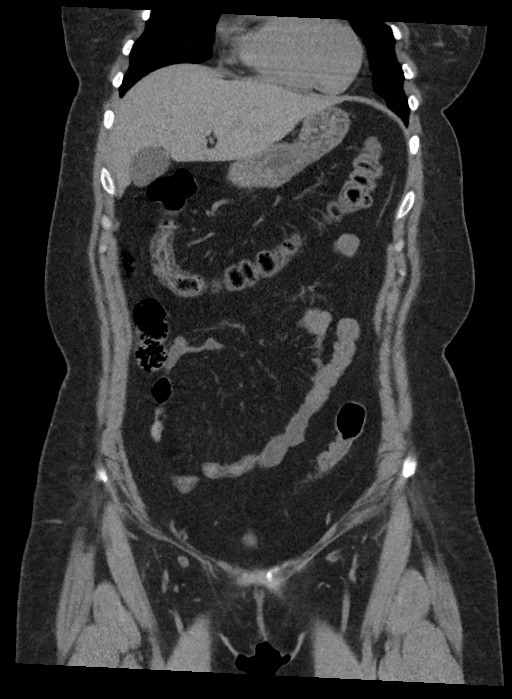
[im 58/130  soft-tissue]
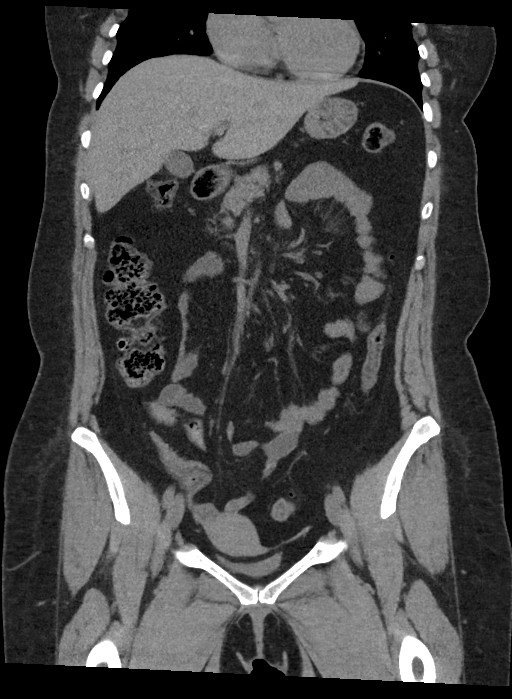
[im 58/130  bone]
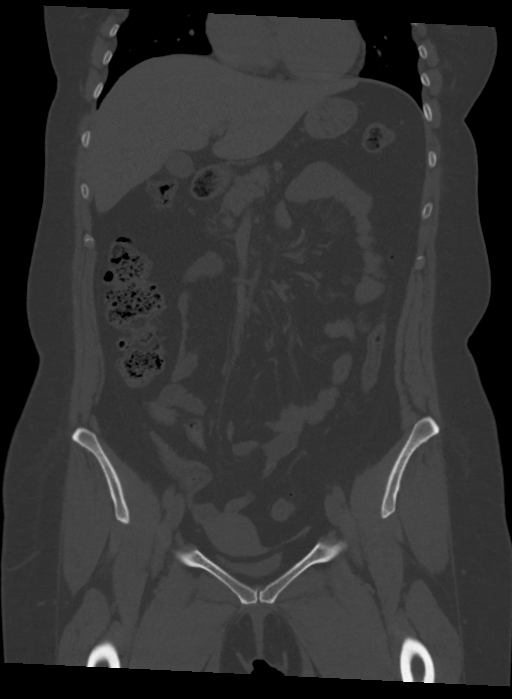
[im 72/130  soft-tissue]
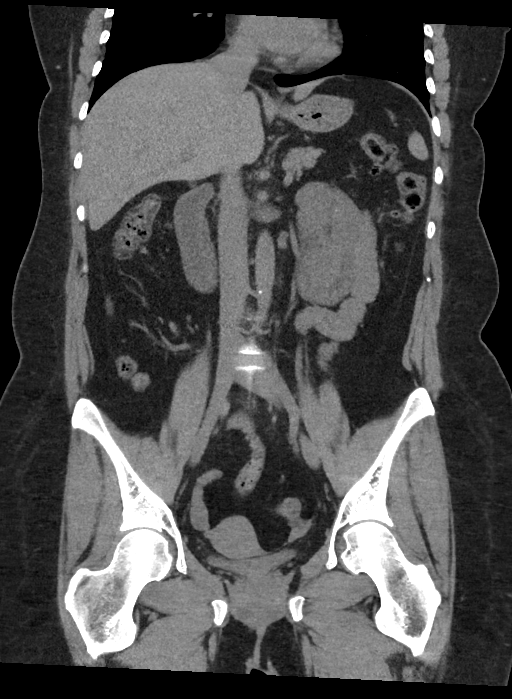

[Series 6: sagittal · sagittal · 0.60mm/px · 1 of 158 slices shown, 2 images]
[im 53/158  soft-tissue]
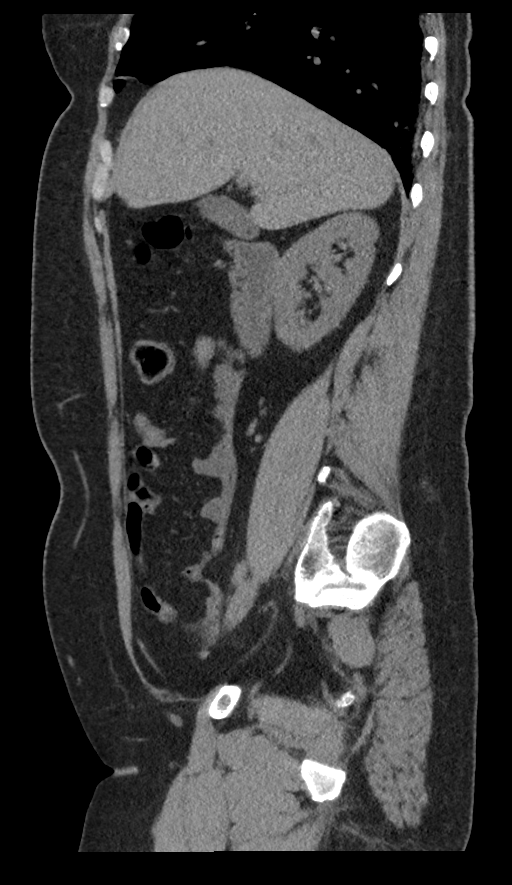
[im 53/158  bone]
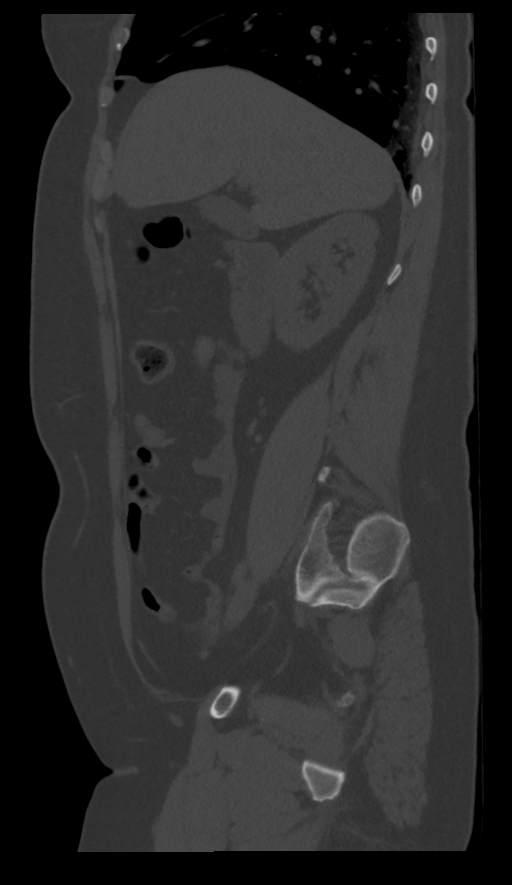

[9 of 46 positions shown; findings below may reference images not displayed]

FINDINGS: Lower chest: There is bibasilar atelectasis. No lung base
consolidation.

Hepatobiliary: No focal liver lesions are evident on this
noncontrast enhanced study. There are several tiny noncalcified
gallstones, potentially containing nitrogen given the decreased
attenuation of these tiny gallstones. No gallbladder wall thickening
evident. No biliary duct dilatation.

Pancreas: No pancreatic mass or inflammatory focus.

Spleen: No splenic lesions are evident. There is a small accessory
spleen medially.

Adrenals/Urinary Tract: Adrenals bilaterally appear normal. There is
no evident renal mass on either side. There is mild hydronephrosis
on the left. There is no hydronephrosis on the right. Left kidney is
subtly edematous. No intrarenal calculus is evident on either side.
There is a 4 mm calculus in the distal left ureter. No other
ureteral calculi are evident. Urinary bladder is midline with wall
thickness within normal limits.

Stomach/Bowel: There are occasional sigmoid diverticula without
diverticulitis. There is no appreciable bowel wall or mesenteric
thickening. No evident bowel obstruction. No free air or portal
venous air.

Vascular/Lymphatic: No abdominal aortic aneurysm. Minimal
calcification noted in the aorta. Major mesenteric vessels appear
patent on this noncontrast enhanced study. No adenopathy is
appreciable in the abdomen or pelvis. There are a few tiny
mesenteric lymph nodes, regarded as nonspecific.

Reproductive: The uterus is anteverted. There is no evident pelvic
mass.

Other: The appendix appears normal. No abscess or ascites is evident
in the abdomen or pelvis.

Musculoskeletal: There are no blastic or lytic bone lesions. There
is no intramuscular or abdominal wall lesion.
IMPRESSION: 1. 4 mm calculus distal left ureter with mild hydronephrosis on the
left. Left kidney is subtly edematous.

2. Cholelithiasis with several small noncalcified gallstones. No
gallbladder wall thickening evident by CT.

3. No evident bowel obstruction. No abscess in the abdomen or
pelvis. Appendix appears normal. There are occasional sigmoid
diverticula without diverticulitis.

## 2019-12-27 ENCOUNTER — Encounter: Payer: No Typology Code available for payment source | Admitting: Obstetrics and Gynecology

## 2020-02-17 IMAGING — US US ABDOMEN LIMITED
1 series · 14 of 25 positions shown · non-contrast
Comparison: CT 12/01/2017.

CLINICAL DATA: RUQ pain.

EXAM:
ULTRASOUND ABDOMEN LIMITED RIGHT UPPER QUADRANT

[Series 1: us abdomen limited · 0.21mm/px · 14 of 47 slices shown]
[im 1/47]
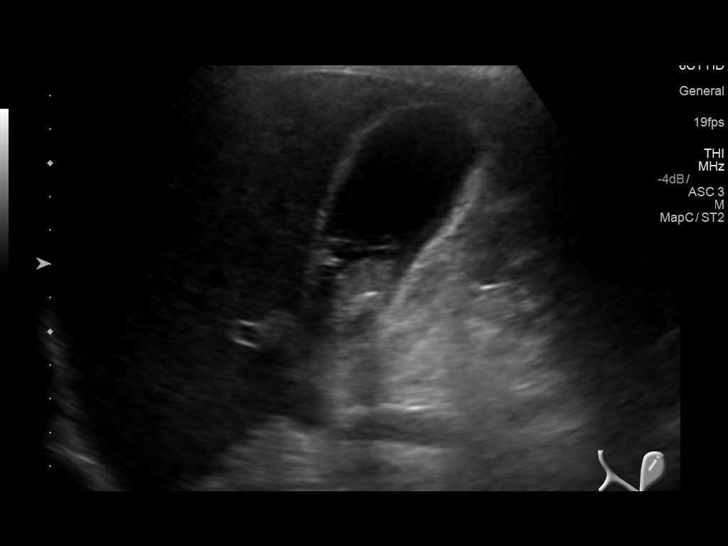
[im 4/47]
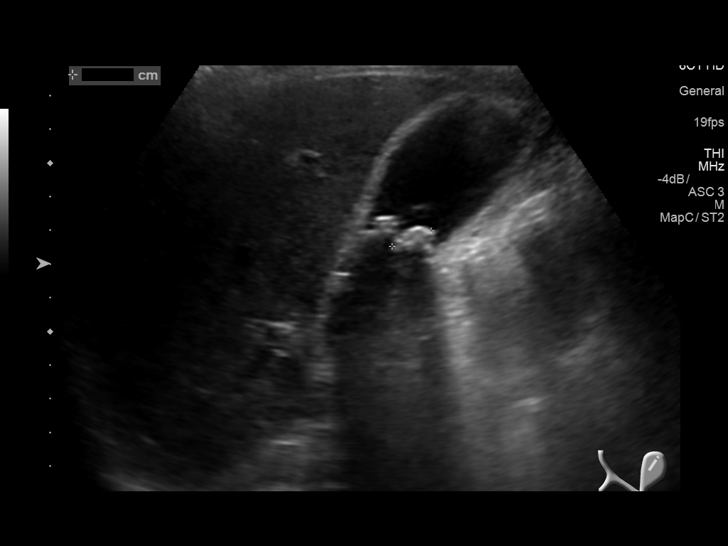
[im 8/47]
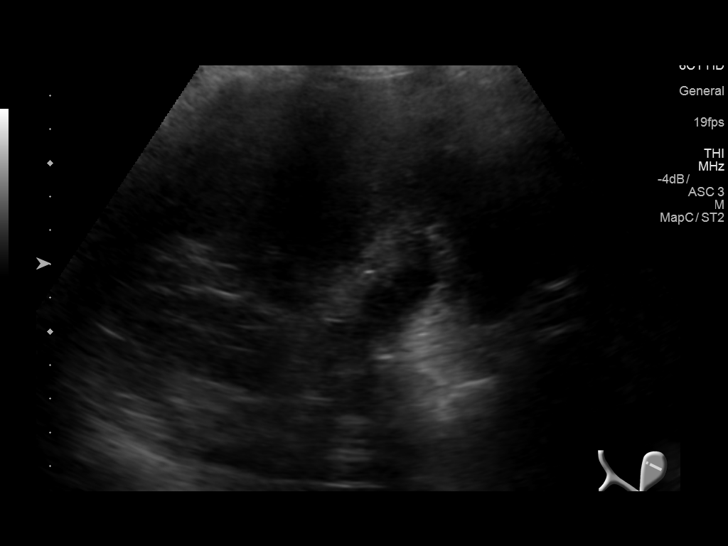
[im 12/47]
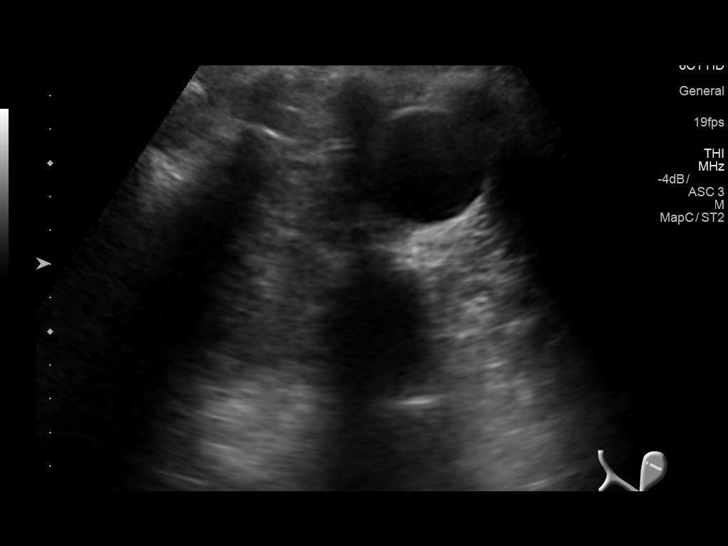
[im 16/47]
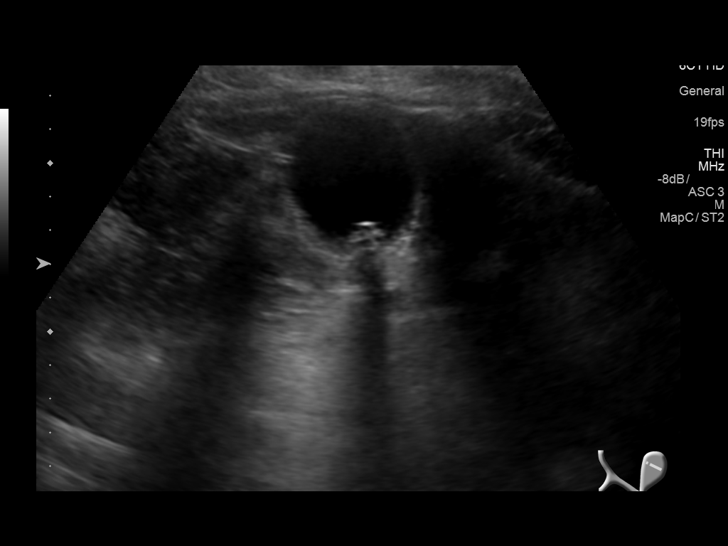
[im 18/47]
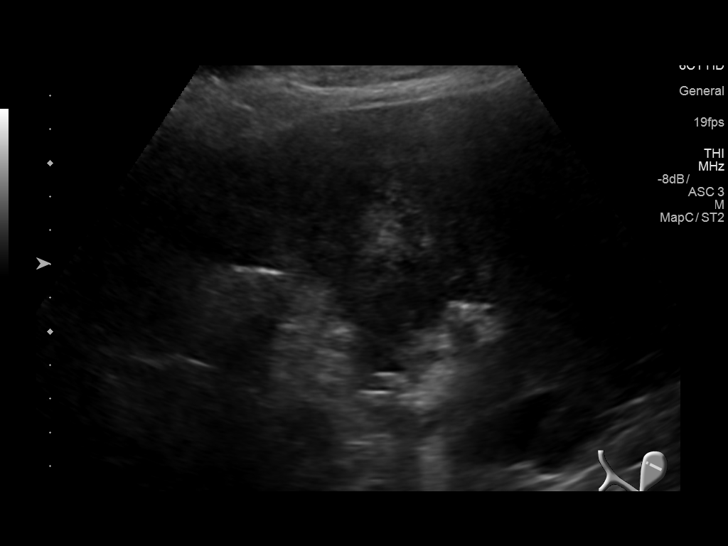
[im 22/47]
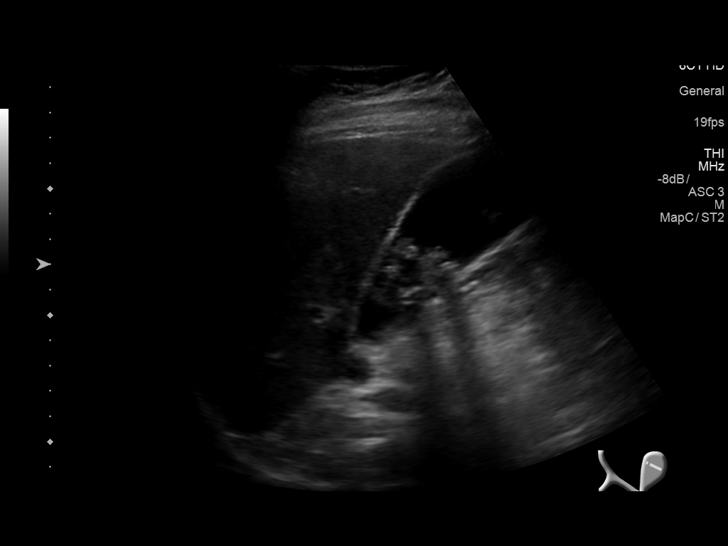
[im 25/47]
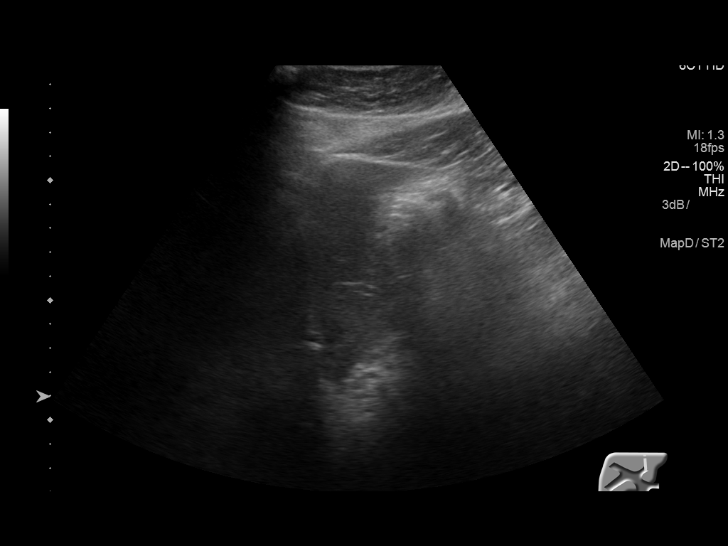
[im 29/47]
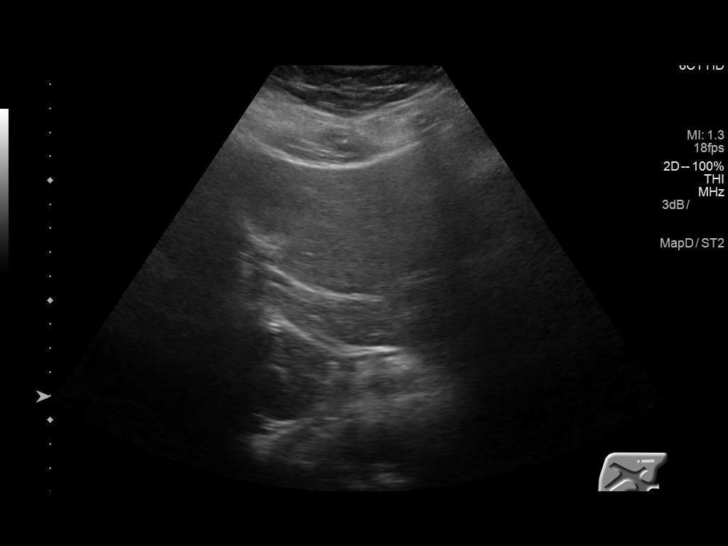
[im 31/47]
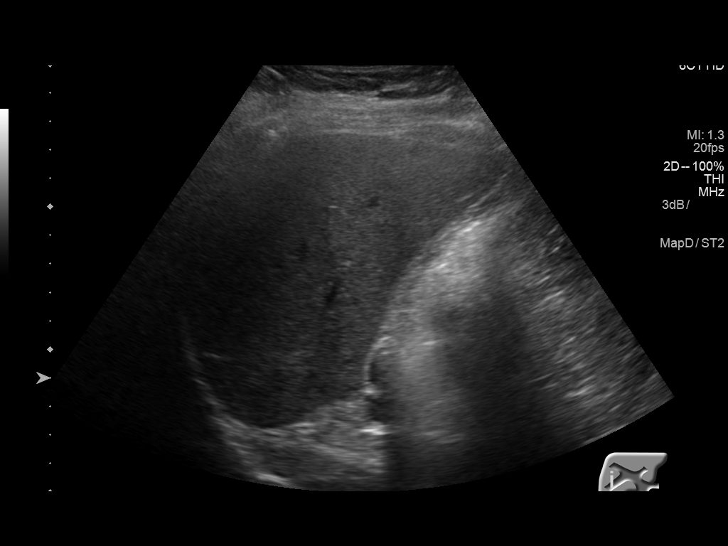
[im 35/47]
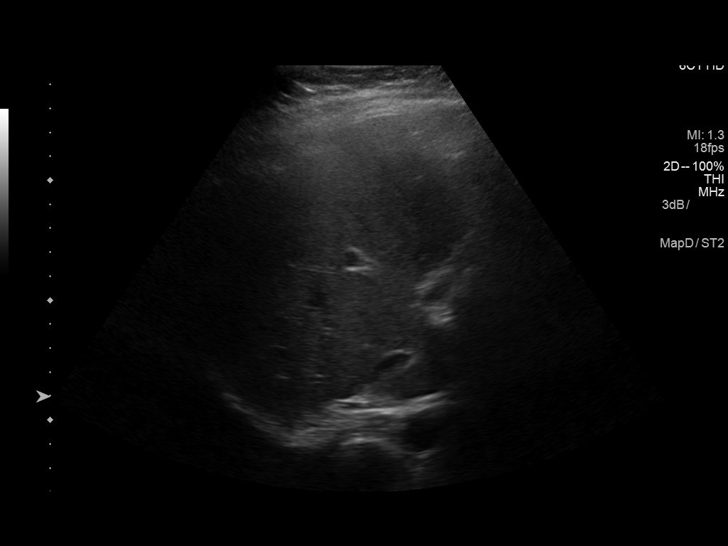
[im 39/47]
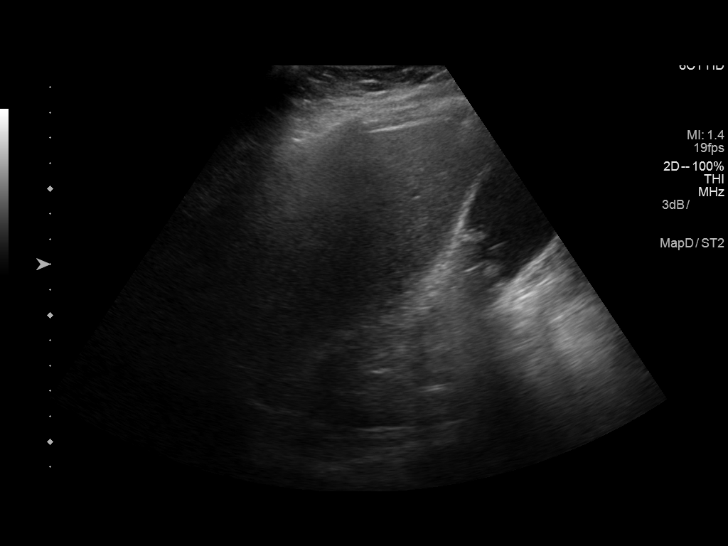
[im 43/47]
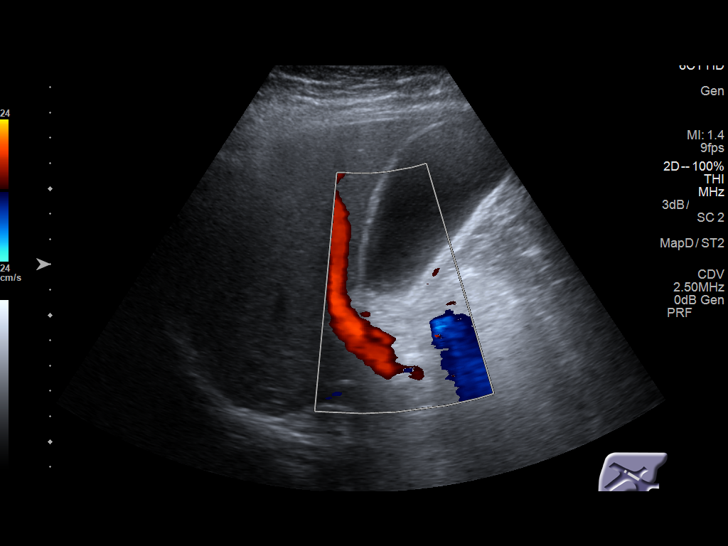
[im 47/47]
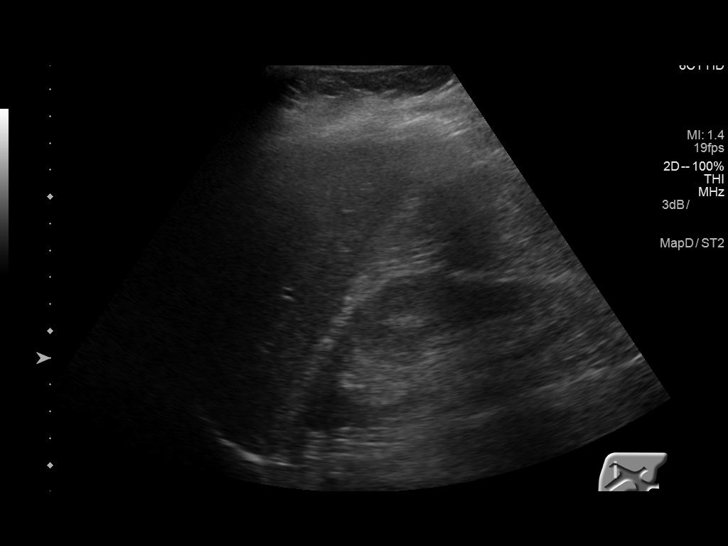

[14 of 25 positions shown; findings below may reference images not displayed]

FINDINGS: Gallbladder:

Cholelithiasis.

Common bile duct:

Diameter: 1.7 mm

Liver:

No focal lesion identified. Within normal limits in parenchymal
echogenicity. Portal vein is patent on color Doppler imaging with
normal direction of blood flow towards the liver.
IMPRESSION: Gallstones. Sludge. No evidence of cholecystitis. No biliary
distention.

## 2020-04-25 NOTE — Patient Instructions (Signed)
Colonoscopy, Adult A colonoscopy is a procedure to look at the entire large intestine. This procedure is done using a long, thin, flexible tube that has a camera on the end. You may have a colonoscopy:  As a part of normal colorectal screening.  If you have certain symptoms, such as: ? A low number of red blood cells in your blood (anemia). ? Diarrhea that does not go away. ? Pain in your abdomen. ? Blood in your stool. A colonoscopy can help screen for and diagnose medical problems, including:  Tumors.  Extra tissue that grows where mucus forms (polyps).  Inflammation.  Areas of bleeding. Tell your health care provider about:  Any allergies you have.  All medicines you are taking, including vitamins, herbs, eye drops, creams, and over-the-counter medicines.  Any problems you or family members have had with anesthetic medicines.  Any blood disorders you have.  Any surgeries you have had.  Any medical conditions you have.  Any problems you have had with having bowel movements.  Whether you are pregnant or may be pregnant. What are the risks? Generally, this is a safe procedure. However, problems may occur, including:  Bleeding.  Damage to your intestine.  Allergic reactions to medicines given during the procedure.  Infection. This is rare. What happens before the procedure? Eating and drinking restrictions Follow instructions from your health care provider about eating or drinking restrictions, which may include:  A few days before the procedure: ? Follow a low-fiber diet. ? Avoid nuts, seeds, dried fruit, raw fruits, and vegetables.  1-3 days before the procedure: ? Eat only gelatin dessert or ice pops. ? Drink only clear liquids, such as water, clear juice, clear broth or bouillon, black coffee or tea, or clear soft drinks or sports drinks. ? Avoid liquids that contain red or purple dye.  The day of the procedure: ? Do not eat solid foods. You may  continue to drink clear liquids until up to 2 hours before the procedure. ? Do not eat or drink anything starting 2 hours before the procedure, or within the time period that your health care provider recommends. Bowel prep If you were prescribed a bowel prep to take by mouth (orally) to clean out your colon:  Take it as told by your health care provider. Starting the day before your procedure, you will need to drink a large amount of liquid medicine. The liquid will cause you to have many bowel movements of loose stool until your stool becomes almost clear or light green.  If your skin or the opening between the buttocks (anus) gets irritated from diarrhea, you may relieve the irritation using: ? Wipes with medicine in them, such as adult wet wipes with aloe and vitamin E. ? A product to soothe skin, such as petroleum jelly.  If you vomit while drinking the bowel prep: ? Take a break for up to 60 minutes. ? Begin the bowel prep again. ? Call your health care provider if you keep vomiting or you cannot take the bowel prep without vomiting.  To clean out your colon, you may also be given: ? Laxative medicines. These help you have a bowel movement. ? Instructions for enema use. An enema is liquid medicine injected into your rectum. Medicines Ask your health care provider about:  Changing or stopping your regular medicines or supplements. This is especially important if you are taking iron supplements, diabetes medicines, or blood thinners.  Taking medicines such as aspirin and ibuprofen. These medicines  can thin your blood. Do not take these medicines unless your health care provider tells you to take them.  Taking over-the-counter medicines, vitamins, herbs, and supplements. General instructions  Ask your health care provider what steps will be taken to help prevent infection. These may include washing skin with a germ-killing soap.  Plan to have someone take you home from the hospital  or clinic. What happens during the procedure?   An IV will be inserted into one of your veins.  You may be given one or more of the following: ? A medicine to help you relax (sedative). ? A medicine to numb the area (local anesthetic). ? A medicine to make you fall asleep (general anesthetic). This is rarely needed.  You will lie on your side with your knees bent.  The tube will: ? Have oil or gel put on it (be lubricated). ? Be inserted into your anus. ? Be gently eased through all parts of your large intestine.  Air will be sent into your colon to keep it open. This may cause some pressure or cramping.  Images will be taken with the camera and will appear on a screen.  A small tissue sample may be removed to be looked at under a microscope (biopsy). The tissue may be sent to a lab for testing if any signs of problems are found.  If small polyps are found, they may be removed and checked for cancer cells.  When the procedure is finished, the tube will be removed. The procedure may vary among health care providers and hospitals. What happens after the procedure?  Your blood pressure, heart rate, breathing rate, and blood oxygen level will be monitored until you leave the hospital or clinic.  You may have a small amount of blood in your stool.  You may pass gas and have mild cramping or bloating in your abdomen. This is caused by the air that was used to open your colon during the exam.  Do not drive for 24 hours after the procedure.  It is up to you to get the results of your procedure. Ask your health care provider, or the department that is doing the procedure, when your results will be ready. Summary  A colonoscopy is a procedure to look at the entire large intestine.  Follow instructions from your health care provider about eating and drinking before the procedure.  If you were prescribed an oral bowel prep to clean out your colon, take it as told by your health care  provider.  During the colonoscopy, a flexible tube with a camera on its end is inserted into the anus and then passed into the other parts of the large intestine. This information is not intended to replace advice given to you by your health care provider. Make sure you discuss any questions you have with your health care provider. Document Revised: 11/04/2018 Document Reviewed: 11/04/2018 Elsevier Patient Education  2020 [employer] Preventive Care 80-19 Years Old, Female Preventive care refers to visits with your health care provider and lifestyle choices that can promote health and wellness. This includes:  A yearly physical exam. This may also be called an annual well check.  Regular dental visits and eye exams.  Immunizations.  Screening for certain conditions.  Healthy lifestyle choices, such as eating a healthy diet, getting regular exercise, not using drugs or products that contain nicotine and tobacco, and limiting alcohol use. What can I expect for my preventive care visit? Physical exam Your health  care provider will check your:  Height and weight. This may be used to calculate body mass index (BMI), which tells if you are at a healthy weight.  Heart rate and blood pressure.  Skin for abnormal spots. Counseling Your health care provider may ask you questions about your:  Alcohol, tobacco, and drug use.  Emotional well-being.  Home and relationship well-being.  Sexual activity.  Eating habits.  Work and work Statistician.  Method of birth control.  Menstrual cycle.  Pregnancy history. What immunizations do I need?  Influenza (flu) vaccine  This is recommended every year. Tetanus, diphtheria, and pertussis (Tdap) vaccine  You may need a Td booster every 10 years. Varicella (chickenpox) vaccine  You may need this if you have not been vaccinated. Zoster (shingles) vaccine  You may need this after age 75. Measles, mumps, and rubella (MMR)  vaccine  You may need at least one dose of MMR if you were born in 1957 or later. You may also need a second dose. Pneumococcal conjugate (PCV13) vaccine  You may need this if you have certain conditions and were not previously vaccinated. Pneumococcal polysaccharide (PPSV23) vaccine  You may need one or two doses if you smoke cigarettes or if you have certain conditions. Meningococcal conjugate (MenACWY) vaccine  You may need this if you have certain conditions. Hepatitis A vaccine  You may need this if you have certain conditions or if you travel or work in places where you may be exposed to hepatitis A. Hepatitis B vaccine  You may need this if you have certain conditions or if you travel or work in places where you may be exposed to hepatitis B. Haemophilus influenzae type b (Hib) vaccine  You may need this if you have certain conditions. Human papillomavirus (HPV) vaccine  If recommended by your health care provider, you may need three doses over 6 months. You may receive vaccines as individual doses or as more than one vaccine together in one shot (combination vaccines). Talk with your health care provider about the risks and benefits of combination vaccines. What tests do I need? Blood tests  Lipid and cholesterol levels. These may be checked every 5 years, or more frequently if you are over 51 years old.  Hepatitis C test.  Hepatitis B test. Screening  Lung cancer screening. You may have this screening every year starting at age 76 if you have a 30-pack-year history of smoking and currently smoke or have quit within the past 15 years.  Colorectal cancer screening. All adults should have this screening starting at age 15 and continuing until age 75. Your health care provider may recommend screening at age 6 if you are at increased risk. You will have tests every 1-10 years, depending on your results and the type of screening test.  Diabetes screening. This is done by  checking your blood sugar (glucose) after you have not eaten for a while (fasting). You may have this done every 1-3 years.  Mammogram. This may be done every 1-2 years. Talk with your health care provider about when you should start having regular mammograms. This may depend on whether you have a family history of breast cancer.  BRCA-related cancer screening. This may be done if you have a family history of breast, ovarian, tubal, or peritoneal cancers.  Pelvic exam and Pap test. This may be done every 3 years starting at age 80. Starting at age 44, this may be done every 5 years if you have a Pap  test in combination with an HPV test. Other tests  Sexually transmitted disease (STD) testing.  Bone density scan. This is done to screen for osteoporosis. You may have this scan if you are at high risk for osteoporosis. Follow these instructions at home: Eating and drinking  Eat a diet that includes fresh fruits and vegetables, whole grains, lean protein, and low-fat dairy.  Take vitamin and mineral supplements as recommended by your health care provider.  Do not drink alcohol if: ? Your health care provider tells you not to drink. ? You are pregnant, may be pregnant, or are planning to become pregnant.  If you drink alcohol: ? Limit how much you have to 0-1 drink a day. ? Be aware of how much alcohol is in your drink. In the [place] one drink equals one 12 oz bottle of beer (355 mL), one 5 oz glass of wine (148 mL), or one 1 oz glass of hard liquor (44 mL). Lifestyle  Take daily care of your teeth and gums.  Stay active. Exercise for at least 30 minutes on 5 or more days each week.  Do not use any products that contain nicotine or tobacco, such as cigarettes, e-cigarettes, and chewing tobacco. If you need help quitting, ask your health care provider.  If you are sexually active, practice safe sex. Use a condom or other form of birth control (contraception) in order to prevent pregnancy  and STIs (sexually transmitted infections).  If told by your health care provider, take low-dose aspirin daily starting at age 94. What's next?  Visit your health care provider once a year for a well check visit.  Ask your health care provider how often you should have your eyes and teeth checked.  Stay up to date on all vaccines. This information is not intended to replace advice given to you by your health care provider. Make sure you discuss any questions you have with your health care provider. Document Revised: 12/23/2017 Document Reviewed: 12/23/2017 Elsevier Patient Education  2020 [employer] Breast Self-Awareness Breast self-awareness is knowing how your breasts look and feel. Doing breast self-awareness is important. It allows you to catch a breast problem early while it is still small and can be treated. All women should do breast self-awareness, including women who have had breast implants. Tell your doctor if you notice a change in your breasts. What you need:  A mirror.  A well-lit room. How to do a breast self-exam A breast self-exam is one way to learn what is normal for your breasts and to check for changes. To do a breast self-exam: Look for changes  1. Take off all the clothes above your waist. 2. Stand in front of a mirror in a room with good lighting. 3. Put your hands on your hips. 4. Push your hands down. 5. Look at your breasts and nipples in the mirror to see if one breast or nipple looks different from the other. Check to see if: ? The shape of one breast is different. ? The size of one breast is different. ? There are wrinkles, dips, and bumps in one breast and not the other. 6. Look at each breast for changes in the skin, such as: ? Redness. ? Scaly areas. 7. Look for changes in your nipples, such as: ? Liquid around the nipples. ? Bleeding. ? Dimpling. ? Redness. ? A change in where the nipples are. Feel for changes  1. Lie on your back on  the floor. 2. Feel  each breast. To do this, follow these steps: ? Pick a breast to feel. ? Put the arm closest to that breast above your head. ? Use your other arm to feel the nipple area of your breast. Feel the area with the pads of your three middle fingers by making small circles with your fingers. For the first circle, press lightly. For the second circle, press harder. For the third circle, press even harder. ? Keep making circles with your fingers at the different pressures as you move down your breast. Stop when you feel your ribs. ? Move your fingers a little toward the center of your body. ? Start making circles with your fingers again, this time going up until you reach your collarbone. ? Keep making up-and-down circles until you reach your armpit. Remember to keep using the three pressures. ? Feel the other breast in the same way. 3. Sit or stand in the tub or shower. 4. With soapy water on your skin, feel each breast the same way you did in step 2 when you were lying on the floor. Write down what you find Writing down what you find can help you remember what to tell your doctor. Write down:  What is normal for each breast.  Any changes you find in each breast, including: ? The kind of changes you find. ? Whether you have pain. ? Size and location of any lumps.  When you last had your menstrual period. General tips  Check your breasts every month.  If you are breastfeeding, the best time to check your breasts is after you feed your baby or after you use a breast pump.  If you get menstrual periods, the best time to check your breasts is 5-7 days after your menstrual period is over.  With time, you will become comfortable with the self-exam, and you will begin to know if there are changes in your breasts. Contact a doctor if you:  See a change in the shape or size of your breasts or nipples.  See a change in the skin of your breast or nipples, such as red or scaly  skin.  Have fluid coming from your nipples that is not normal.  Find a lump or thick area that was not there before.  Have pain in your breasts.  Have any concerns about your breast health. Summary  Breast self-awareness includes looking for changes in your breasts, as well as feeling for changes within your breasts.  Breast self-awareness should be done in front of a mirror in a well-lit room.  You should check your breasts every month. If you get menstrual periods, the best time to check your breasts is 5-7 days after your menstrual period is over.  Let your doctor know of any changes you see in your breasts, including changes in size, changes on the skin, pain or tenderness, or fluid from your nipples that is not normal. This information is not intended to replace advice given to you by your health care provider. Make sure you discuss any questions you have with your health care provider. Document Revised: 11/30/2017 Document Reviewed: 11/30/2017 Elsevier Patient Education  [place].

## 2020-04-25 NOTE — Progress Notes (Signed)
Annual Exam-Pt c/o heavy irregular cycles with clots. Pt stopped taking the Aygestin 5 mg because the medication made her cycles worst. No other issues.

## 2020-04-26 ENCOUNTER — Other Ambulatory Visit (HOSPITAL_COMMUNITY)
Admission: RE | Admit: 2020-04-26 | Discharge: 2020-04-26 | Disposition: A | Discharge status: Home / Self Care | Payer: Managed Care, Other (non HMO) | Source: Ambulatory Visit | Attending: Obstetrics and Gynecology | Admitting: Obstetrics and Gynecology

## 2020-04-26 ENCOUNTER — Ambulatory Visit (INDEPENDENT_AMBULATORY_CARE_PROVIDER_SITE_OTHER): Payer: No Typology Code available for payment source | Admitting: Obstetrics and Gynecology

## 2020-04-26 ENCOUNTER — Other Ambulatory Visit: Payer: Self-pay

## 2020-04-26 ENCOUNTER — Encounter: Payer: Self-pay | Admitting: Obstetrics and Gynecology

## 2020-04-26 VITALS — BP 113/75 | HR 82 | Ht 63.0 in | Wt 155.1 lb

## 2020-04-26 DIAGNOSIS — Z131 Encounter for screening for diabetes mellitus: Secondary | ICD-10-CM

## 2020-04-26 DIAGNOSIS — Z1231 Encounter for screening mammogram for malignant neoplasm of breast: Secondary | ICD-10-CM

## 2020-04-26 DIAGNOSIS — N938 Other specified abnormal uterine and vaginal bleeding: Secondary | ICD-10-CM

## 2020-04-26 DIAGNOSIS — Z01419 Encounter for gynecological examination (general) (routine) without abnormal findings: Secondary | ICD-10-CM | POA: Diagnosis not present

## 2020-04-26 DIAGNOSIS — Z124 Encounter for screening for malignant neoplasm of cervix: Secondary | ICD-10-CM | POA: Diagnosis not present

## 2020-04-26 NOTE — Progress Notes (Signed)
GYNECOLOGY ANNUAL PHYSICAL EXAM PROGRESS NOTE  Subjective:    Kristi Ibarra is a 44 y.o. G0P0 female who presents for an annual exam.  The patient is sexually active. The patient wears seatbelts: yes. The patient participates in regular exercise: no. Has the patient ever been transfused or tattooed?: no. The patient reports that there is not domestic violence in her life.   The patient has the following complaints today:  1. The patient notes that her menstrual cycles are still irregular.  She will have several regular cycles followed having 2 cycles in the same month or having a more prolonged heavy cycle (4-30 days). Has been dealing with this issue for a few years. Tried Aygestin last year which initially helped, but after a few months of use, actually made her bleeding worse. Had a negative workup in 2019 with biopsy and ultrasound.    Menstrual History: Menarche age: 9 Patient's last menstrual period was 04/26/2020. Period Duration (Days): 3-31 Period Pattern: (!) Irregular Menstrual Flow: Heavy Menstrual Control: Maxi pad,Tampon Menstrual Control Change Freq (Hours): 10-15 minutes Dysmenorrhea: (!) Moderate Dysmenorrhea Symptoms: Cramping  Gynecologic History Menarche age: 78 Contraception: none History of STI's: Denies Last Pap: 02/03/2016. Results were: normal.  Denies h/o abnormal pap smears. Last mammogram: Patient has never had a mammogram.     Upstream - 04/26/20 0813      Pregnancy Intention Screening   Does the patient want to become pregnant in the next year? No    Does the patient's partner want to become pregnant in the next year? No    Would the patient like to discuss contraceptive options today? Yes      Contraception Wrap Up   Current Method No Method - Other Reason          The pregnancy intention screening data noted above was reviewed. Potential methods of contraception were discussed. The patient elected to proceed with No Method - Other  Reason.    OB History  Gravida Para Term Preterm AB Living  0 0 0 0 0 0  SAB IAB Ectopic Multiple Live Births  0 0 0 0 0    Past Medical History:  Diagnosis Date  . Fibrocystic breast changes, left 09/03/2017  . Kidney stones     Past Surgical History:  Procedure Laterality Date  . CHOLECYSTECTOMY N/A 06/03/2018   Procedure: LAPAROSCOPIC CHOLECYSTECTOMY NO INTRAOPERATIVE CHOLANGIOGRAM;  Surgeon: Henrene Dodge, MD;  Location: [facility] ORS;  Service: General;  Laterality: N/A;  . GALLBLADDER SURGERY  06/03/2018      Family History  Problem Relation Age of Onset  . Asthma Mother   . Stroke Mother     Social History   Socioeconomic History  . Marital status: Married    Spouse name: Not on file  . Number of children: Not on file  . Years of education: Not on file  . Highest education level: Not on file  Occupational History  . Not on file  Tobacco Use  . Smoking status: Current Every Day Smoker    Packs/day: 0.50  . Smokeless tobacco: Never Used  Vaping Use  . Vaping Use: Former  Substance and Sexual Activity  . Alcohol use: No  . Drug use: No  . Sexual activity: Yes    Partners: Male    Birth control/protection: None  Other Topics Concern  . Not on file  Social History Narrative  . Not on file   Social Determinants of Health   Financial Resource  Strain: Not on file  Food Insecurity: Not on file  Transportation Needs: Not on file  Physical Activity: Not on file  Stress: Not on file  Social Connections: Not on file  Intimate Partner Violence: Not on file    Current Outpatient Medications on File Prior to Visit  Medication Sig Dispense Refill  . ibuprofen (ADVIL,MOTRIN) 600 MG tablet Take 1 tablet (600 mg total) by mouth every 8 (eight) hours as needed for fever, headache, mild pain or moderate pain. 30 tablet 0  . omeprazole (PRILOSEC) 20 MG capsule Take 1 capsule (20 mg total) by mouth 2 (two) times daily before a meal. (Patient taking differently: Take 20  mg by mouth daily as needed (reflux symptoms).) 180 capsule 0  . ondansetron (ZOFRAN ODT) 4 MG disintegrating tablet Take 1 tablet (4 mg total) by mouth every 8 (eight) hours as needed for nausea or vomiting. 20 tablet 0  . tamsulosin (FLOMAX) 0.4 MG CAPS capsule Take 1 capsule (0.4 mg total) by mouth daily. (Patient taking differently: Take 0.4 mg by mouth daily. PRN) 30 capsule 0  . norethindrone (AYGESTIN) 5 MG tablet Take 1 tablet (5 mg total) by mouth daily. (Patient not taking: Reported on 04/26/2020) 30 tablet 2   No current facility-administered medications on file prior to visit.    Allergies  Allergen Reactions  . Erythromycin Hives, Swelling and Rash     Review of Systems Constitutional: negative for chills, fatigue, fevers and sweats Eyes: negative for irritation, redness and visual disturbance Ears, nose, mouth, throat, and face: negative for hearing loss, nasal congestion, snoring and tinnitus Respiratory: negative for asthma, cough, sputum Cardiovascular: negative for chest pain, dyspnea, exertional chest pressure/discomfort, irregular heart beat, palpitations and syncope Gastrointestinal: negative for abdominal pain, nausea and vomiting, diarrhea or constipation. Genitourinary: positive for abnormal menstrual periods (see HPI). No genital lesions, sexual problems and vaginal discharge, dysuria and urinary incontinence Integument/breast: negative for breast lump, breast tenderness and nipple discharge Hematologic/lymphatic: negative for bleeding and easy bruising Musculoskeletal:negative for back pain and muscle weakness Neurological: negative for dizziness, headaches, vertigo and weakness Endocrine: negative for diabetic symptoms including polydipsia, polyuria and skin dryness Allergic/Immunologic: negative for hay fever and urticaria        Objective:  Blood pressure 113/75, pulse 82, height 5\' 3"  (1.6 m), weight 155 lb 1.6 oz (70.4 kg), last menstrual period  04/26/2020. Body mass index is 27.47 kg/m.  General Appearance:    Alert, cooperative, no distress, appears stated age, overweight  Head:    Normocephalic, without obvious abnormality, atraumatic  Eyes:    PERRL, conjunctiva/corneas clear, EOM's intact, both eyes  Ears:    Normal external ear canals, both ears  Nose:   Nares normal, septum midline, mucosa normal, no drainage or sinus tenderness  Throat:   Lips, mucosa, and tongue normal; teeth and gums normal  Neck:   Supple, symmetrical, trachea midline, no adenopathy; thyroid: no enlargement/tenderness/nodules; no carotid bruit or JVD  Back:     Symmetric, no curvature, ROM normal, no CVA tenderness  Lungs:     Clear to auscultation bilaterally, respirations unlabored  Chest Wall:    No tenderness or deformity   Heart:    Regular rate and rhythm, S1 and S2 normal, no murmur, rub or gallop  Breast Exam:    No tenderness, masses, or nipple abnormality  Abdomen:     Soft, non-tender, bowel sounds active all four quadrants, no masses, no organomegaly.    Genitalia:    Pelvic:external  genitalia normal, vagina without lesions, discharge, or tenderness, small amount of dark red blood present.  Rectovaginal septum  normal. Cervix normal in appearance, no cervical motion tenderness, no adnexal masses or tenderness. Uterus normal shape, size, mobile, regular contours, nontender.  Rectal:    Normal external sphincter.  No hemorrhoids appreciated. Internal exam not done.   Extremities:   Extremities normal, atraumatic, no cyanosis or edema  Pulses:   2+ and symmetric all extremities  Skin:   Skin color, texture, turgor normal, no rashes or lesions  Lymph nodes:   Cervical, supraclavicular, and axillary nodes normal  Neurologic:   CNII-XII intact, normal strength, sensation and reflexes throughout   .  Labs:  Lab Results  Component Value Date   WBC 19.2 (H) 06/04/2018   HGB 12.6 06/04/2018   HCT 38.2 06/04/2018   MCV 92.9 06/04/2018   PLT 308  06/04/2018    Lab Results  Component Value Date   CREATININE 0.63 06/03/2018   BUN 9 06/03/2018   NA 139 06/03/2018   K 3.9 06/03/2018   CL 106 06/03/2018   CO2 27 06/03/2018    Lab Results  Component Value Date   ALT 15 06/03/2018   AST 15 06/03/2018   ALKPHOS 84 06/03/2018   BILITOT 0.4 06/03/2018    Lab Results  Component Value Date   TSH 2.580 08/25/2017     Assessment:   1. Encounter for well woman exam with routine gynecological exam   2. Pap smear for cervical cancer screening   3. DUB (dysfunctional uterine bleeding)   4. Breast cancer screening by mammogram   5. Screening for diabetes mellitus       Plan:    Blood tests: CBC with diff, Comprehensive metabolic panel, A1c, TSH and Lipoproteins (to return for fasting labs). Breast self exam technique reviewed and patient encouraged to perform self-exam monthly. Contraception: none. Discussed healthy lifestyle modifications. Mammogram ordered.  Pap smear up to date. Discussed management options again for DUB, including tranexamic acid (Lysteda), oral progesterone, Depo Provera, Levonogestrel IUD, endometrial ablation or hysterectomy as definitive surgical management. Briefly discussed risks and benefits of each method.   Patient would like to consider endometrial ablation.  Printed patient education handouts were given to the patient to review at home. If surgery desired, patient to call office to schedule pre-op appointment.  COVID vaccination status: declines Flu vaccine: declines   Hildred Laser, MD [facility]

## 2020-05-03 ENCOUNTER — Other Ambulatory Visit: Payer: Self-pay

## 2020-05-03 ENCOUNTER — Other Ambulatory Visit: Payer: No Typology Code available for payment source

## 2020-05-04 LAB — CBC
Hematocrit: 43.9 % (ref 34.0–46.6)
Hemoglobin: 15 g/dL (ref 11.1–15.9)
MCH: 30.8 pg (ref 26.6–33.0)
MCHC: 34.2 g/dL (ref 31.5–35.7)
MCV: 90 fL (ref 79–97)
Platelets: 343 10*3/uL (ref 150–450)
RBC: 4.87 x10E6/uL (ref 3.77–5.28)
RDW: 12.4 % (ref 11.7–15.4)
WBC: 8 10*3/uL (ref 3.4–10.8)

## 2020-05-04 LAB — COMPREHENSIVE METABOLIC PANEL
ALT: 20 IU/L (ref 0–32)
AST: 15 IU/L (ref 0–40)
Albumin/Globulin Ratio: 1.6 (ref 1.2–2.2)
Albumin: 4.1 g/dL (ref 3.8–4.8)
Alkaline Phosphatase: 94 IU/L (ref 44–121)
BUN/Creatinine Ratio: 15 (ref 9–23)
BUN: 11 mg/dL (ref 6–24)
Bilirubin Total: 0.3 mg/dL (ref 0.0–1.2)
CO2: 22 mmol/L (ref 20–29)
Calcium: 9.4 mg/dL (ref 8.7–10.2)
Chloride: 102 mmol/L (ref 96–106)
Creatinine, Ser: 0.71 mg/dL (ref 0.57–1.00)
GFR calc Af Amer: 119 mL/min/{1.73_m2} (ref >59)
GFR calc non Af Amer: 103 mL/min/{1.73_m2} (ref >59)
Globulin, Total: 2.5 g/dL (ref 1.5–4.5)
Glucose: 86 mg/dL (ref 65–99)
Potassium: 4.8 mmol/L (ref 3.5–5.2)
Sodium: 139 mmol/L (ref 134–144)
Total Protein: 6.6 g/dL (ref 6.0–8.5)

## 2020-05-04 LAB — HEMOGLOBIN A1C
Est. average glucose Bld gHb Est-mCnc: 117 mg/dL
Hgb A1c MFr Bld: 5.7 % — ABNORMAL HIGH (ref 4.8–5.6)

## 2020-05-04 LAB — LIPID PANEL
Chol/HDL Ratio: 5.3 ratio — ABNORMAL HIGH (ref 0.0–4.4)
Cholesterol, Total: 205 mg/dL — ABNORMAL HIGH (ref 100–199)
HDL: 39 mg/dL — ABNORMAL LOW (ref >39)
LDL Chol Calc (NIH): 134 mg/dL — ABNORMAL HIGH (ref 0–99)
Triglycerides: 178 mg/dL — ABNORMAL HIGH (ref 0–149)
VLDL Cholesterol Cal: 32 mg/dL (ref 5–40)

## 2020-05-04 LAB — TSH: TSH: 1.71 u[IU]/mL (ref 0.450–4.500)

## 2020-05-05 LAB — CYTOLOGY - PAP
Comment: NEGATIVE
Diagnosis: NEGATIVE
Diagnosis: REACTIVE
High risk HPV: NEGATIVE

## 2022-08-21 ENCOUNTER — Ambulatory Visit: Payer: Self-pay | Admitting: Obstetrics and Gynecology

## 2022-08-24 NOTE — Progress Notes (Unsigned)
    GYNECOLOGY PROGRESS NOTE  Subjective:    Patient ID: Kristi Ibarra, female    DOB: 11/22/1975, 47 y.o.   MRN: [id number]  HPI  Patient is a 47 y.o. G0P0 female who presents for evaluation of premenopausal bleeding.   {Common ambulatory SmartLinks:19316}  Review of Systems {ros; complete:30496}   Objective:   There were no vitals taken for this visit. There is no height or weight on file to calculate BMI. General appearance: {general exam:16600} Abdomen: {abdominal exam:16834} Pelvic: {pelvic exam:16852::"cervix normal in appearance","external genitalia normal","no adnexal masses or tenderness","no cervical motion tenderness","rectovaginal septum normal","uterus normal size, shape, and consistency","vagina normal without discharge"} Extremities: {extremity exam:5109} Neurologic: {neuro exam:17854}   Assessment:   No diagnosis found.   Plan:   There are no diagnoses linked to this encounter.    Hildred Laser, MD [facility]

## 2022-08-25 ENCOUNTER — Encounter: Payer: Self-pay | Admitting: Obstetrics and Gynecology

## 2022-08-25 ENCOUNTER — Ambulatory Visit: Payer: No Typology Code available for payment source | Admitting: Obstetrics and Gynecology

## 2022-08-25 VITALS — BP 107/77 | HR 91 | Resp 16 | Ht 63.0 in | Wt 168.6 lb

## 2022-08-25 DIAGNOSIS — N938 Other specified abnormal uterine and vaginal bleeding: Secondary | ICD-10-CM | POA: Diagnosis not present

## 2022-08-25 MED ORDER — TRANEXAMIC ACID 650 MG PO TABS: 1300.0000 mg | ORAL_TABLET | Freq: Three times a day (TID) | ORAL | 2 refills | Status: DC

## 2022-08-31 ENCOUNTER — Telehealth: Payer: Self-pay | Admitting: Obstetrics and Gynecology

## 2022-08-31 ENCOUNTER — Other Ambulatory Visit: Payer: Self-pay

## 2022-08-31 DIAGNOSIS — N938 Other specified abnormal uterine and vaginal bleeding: Secondary | ICD-10-CM

## 2022-08-31 NOTE — Telephone Encounter (Signed)
The patient called and said she has an meeting on tomorrow at 10 am. She would need to reschduled to next available in June. She is rescheduled for 6/4 for both appointments

## 2022-08-31 NOTE — Telephone Encounter (Signed)
I contacted the patient  via phone. I left detailed voicemail about no ultrasound tech. The patient has been scheduled for 5/7 at 10:00 am for ultrasound. The patient to arrived at 9:30 for check in, have a full bladder.

## 2022-09-01 ENCOUNTER — Other Ambulatory Visit: Payer: No Typology Code available for payment source

## 2022-09-01 ENCOUNTER — Ambulatory Visit: Payer: No Typology Code available for payment source | Admitting: Obstetrics and Gynecology

## 2022-09-01 ENCOUNTER — Ambulatory Visit: Payer: No Typology Code available for payment source

## 2022-09-25 NOTE — Progress Notes (Unsigned)
    GYNECOLOGY PROGRESS NOTE  Subjective:    Patient ID: Kristi Ibarra, female    DOB: 04-03-1976, 47 y.o.   MRN: [id number]  HPI  Patient is a 47 y.o. G0P0 female who presents for endometrial biopsy.  She has a history of dysfunctional uterine bleeding over the  past several years. Was last evaluated in 2021.  She reports a trial of Aygestin bur notes that it did not help her much then.  She has a history of dysfunctional uterine bleeding over the  past several years. Was last evaluated in 2021.  She reports a trial of Aygestin bur notes that it did not help her much then.   {Common ambulatory SmartLinks:19316}  Review of Systems {ros; complete:30496}   Objective:   There were no vitals taken for this visit. There is no height or weight on file to calculate BMI. General appearance: {general exam:16600} Abdomen: {abdominal exam:16834} Pelvic: {pelvic exam:16852::"cervix normal in appearance","external genitalia normal","no adnexal masses or tenderness","no cervical motion tenderness","rectovaginal septum normal","uterus normal size, shape, and consistency","vagina normal without discharge"} Extremities: {extremity exam:5109} Neurologic: {neuro exam:17854}   Assessment:   No diagnosis found.   Plan:   There are no diagnoses linked to this encounter.

## 2022-09-29 ENCOUNTER — Other Ambulatory Visit (HOSPITAL_COMMUNITY)
Admission: RE | Admit: 2022-09-29 | Discharge: 2022-09-29 | Disposition: A | Discharge status: Home / Self Care | Payer: No Typology Code available for payment source | Source: Ambulatory Visit | Attending: Obstetrics and Gynecology | Admitting: Obstetrics and Gynecology

## 2022-09-29 ENCOUNTER — Encounter: Payer: Self-pay | Admitting: Obstetrics and Gynecology

## 2022-09-29 ENCOUNTER — Ambulatory Visit (INDEPENDENT_AMBULATORY_CARE_PROVIDER_SITE_OTHER): Payer: No Typology Code available for payment source | Admitting: Obstetrics and Gynecology

## 2022-09-29 ENCOUNTER — Ambulatory Visit: Payer: No Typology Code available for payment source

## 2022-09-29 VITALS — BP 103/72 | HR 86 | Resp 16 | Ht 63.0 in | Wt 171.3 lb

## 2022-09-29 DIAGNOSIS — N938 Other specified abnormal uterine and vaginal bleeding: Secondary | ICD-10-CM

## 2022-09-29 DIAGNOSIS — Z01818 Encounter for other preprocedural examination: Secondary | ICD-10-CM

## 2022-09-29 NOTE — Patient Instructions (Signed)
GYNECOLOGY PRE-OPERATIVE INSTRUCTIONS  You are scheduled for surgery on 10/08/2022.  The name of your procedure is: Hysteroscopy D&C with Endometrial Ablation.   Please read through these instructions carefully regarding preparation for your surgery: Nothing to eat after midnight on the day prior to surgery.  Do not take any medications unless recommended by your provider on day prior to surgery.  Do not take NSAIDs (Motrin, Aleve) or aspirin 7 days prior to surgery.  You may take Tylenol products for minor aches and pains.  You will receive a prescription for pain medications post-operatively.  You will be contacted by phone approximately 1-2 weeks prior to surgery to schedule your pre-operative appointment.  You will need someone to drop off and pick up. You will not be permitted to drive after surgery.  Please call the office if you have any questions regarding your upcoming surgery.    Thank you for choosing [facility] at [facility].

## 2022-09-29 NOTE — H&P (Signed)
GYNECOLOGY PREOPERATIVE HISTORY AND PHYSICAL   Subjective:  Kristi Ibarra is a 47 y.o. G0P0 here for surgical management of dysfunctional uterine bleeding.  She has a history of dysfunctional uterine bleeding over the  past several years. Was last evaluated in 2021.  She reports a trial of Aygestin bur notes that it did not help her much then. Still has a prescription for it so attempts to use as needed when bleeding is very bad.  Reports that her cycles are still irregular, can go from 1-3 months without a cycle, followed by bleeding that can last anywhere from 5-30 days.  Most recent cycle started on 02/27 and ended on 04/04.  Sometimes has to use 2 pads at a time, and changes a pad every 30-45 minutes.  Most recently has not had a cycle so has not trialed the Lysteda that was recommended.  No significant preoperative concerns.  Proposed surgery: Hysteroscopy D&C with endometrial ablation    Pertinent Gynecological History: Contraception: none Last pap smear: 03/2020. Was normal.  Last mammogram: 2019.  Was normal.    Past Medical History:  Diagnosis Date   Fibrocystic breast changes, left 09/03/2017   Kidney stones    Past Surgical History:  Procedure Laterality Date   CHOLECYSTECTOMY N/A 06/03/2018   Procedure: LAPAROSCOPIC CHOLECYSTECTOMY NO INTRAOPERATIVE CHOLANGIOGRAM;  Surgeon: Henrene Dodge, MD;  Location: [facility] ORS;  Service: General;  Laterality: N/A;   GALLBLADDER SURGERY  06/03/2018    OB History  Gravida Para Term Preterm AB Living  0            SAB IAB Ectopic Multiple Live Births               Family History  Problem Relation Age of Onset   Asthma Mother    Stroke Mother     Social History   Socioeconomic History   Marital status: Married    Spouse name: Not on file   Number of children: Not on file   Years of education: Not on file   Highest education level: Not on file  Occupational History   Not on file  Tobacco Use   Smoking status: Every Day     Packs/day: .5    Types: Cigarettes   Smokeless tobacco: Never  Vaping Use   Vaping Use: Former  Substance and Sexual Activity   Alcohol use: No   Drug use: No   Sexual activity: Yes    Partners: Male    Birth control/protection: None  Other Topics Concern   Not on file  Social History Narrative   Not on file   Social Determinants of Health   Financial Resource Strain: Not on file  Food Insecurity: Not on file  Transportation Needs: Not on file  Physical Activity: Not on file  Stress: Not on file  Social Connections: Not on file  Intimate Partner Violence: Not on file    Current Outpatient Medications on File Prior to Visit  Medication Sig Dispense Refill   ibuprofen (ADVIL,MOTRIN) 600 MG tablet Take 1 tablet (600 mg total) by mouth every 8 (eight) hours as needed for fever, headache, mild pain or moderate pain. 30 tablet 0   omeprazole (PRILOSEC) 20 MG capsule Take 1 capsule (20 mg total) by mouth 2 (two) times daily before a meal. (Patient taking differently: Take 20 mg by mouth daily as needed (reflux symptoms).) 180 capsule 0   ondansetron (ZOFRAN ODT) 4 MG disintegrating tablet Take 1 tablet (4 mg total) by  mouth every 8 (eight) hours as needed for nausea or vomiting. 20 tablet 0   tamsulosin (FLOMAX) 0.4 MG CAPS capsule Take 1 capsule (0.4 mg total) by mouth daily. (Patient taking differently: Take 0.4 mg by mouth daily. PRN) 30 capsule 0   tranexamic acid (LYSTEDA) 650 MG TABS tablet Take 2 tablets (1,300 mg total) by mouth 3 (three) times daily. Take during menses for a maximum of five days 30 tablet 2   No current facility-administered medications on file prior to visit.   Allergies  Allergen Reactions   Erythromycin Hives, Swelling and Rash      Review of Systems Constitutional: No recent fever/chills/sweats Respiratory: No recent cough/bronchitis Cardiovascular: No chest pain Gastrointestinal: No recent nausea/vomiting/diarrhea Genitourinary: No UTI  symptoms Hematologic/lymphatic:No history of coagulopathy or recent blood thinner use    Objective:   Blood pressure 103/72, pulse 86, resp. rate 16, height 5\' 3"  (1.6 m), weight 171 lb 4.8 oz (77.7 kg). CONSTITUTIONAL: Well-developed, well-nourished female in no acute distress.  HENT:  Normocephalic, atraumatic, External right and left ear normal. Oropharynx is clear and moist EYES: Conjunctivae and EOM are normal. Pupils are equal, round, and reactive to light. No scleral icterus.  NECK: Normal range of motion, supple, no masses SKIN: Skin is warm and dry. No rash noted. Not diaphoretic. No erythema. No pallor. NEUROLOGIC: Alert and oriented to person, place, and time. Normal reflexes, muscle tone coordination. No cranial nerve deficit noted. PSYCHIATRIC: Normal mood and affect. Normal behavior. Normal judgment and thought content. CARDIOVASCULAR: Normal heart rate noted, regular rhythm RESPIRATORY: Effort and breath sounds normal, no problems with respiration noted ABDOMEN: Soft, nontender, nondistended. PELVIC: Deferred MUSCULOSKELETAL: Normal range of motion. No edema and no tenderness. 2+ distal pulses.    Labs: No results found for this or any previous visit (from the past 336 hour(s)).  Pathology:  Endometrial biopsy pending, performed 09/29/2022.    Imaging Studies: ULTRASOUND REPORT   Location: [facility] Date of Service: 09/29/2022        Indications:Abnormal Uterine Bleeding Findings:  The uterus is anteverted and measures 8.87 x 4.16 x 3.66 cm. Echo texture is homogenous without evidence of focal masses.   The Endometrium measures 6.37 mm.   Right Ovary measures 3.25 x 1.97 x 1.54 cm. It is normal in appearance. Left Ovary measures 2.86 x 2.29 x 2.31 cm. It is normal in appearance. ? Small simple cyst vs dom follicle ; 2.07 x 1.46 x 1.55cm Survey of the adnexa demonstrates no adnexal masses. There is no free fluid in the cul de sac.    Impression: 1. Slightly thickened endometrium with no evidence of masses or polyp 2. Dom follicle vs small cyst seen in LT ovary    Recommendations: 1.Clinical correlation with the patient's History and Physical Exam. 2. Follow up with provider    Waldo Laine, RT  Assessment:    1. DUB (dysfunctional uterine bleeding)   2. Preoperative exam for gynecologic surgery    Plan:   - Counseling: Patient desires surgical management with Endometrial Ablation. Workup is now complete. The risks of surgery were discussed in detail with the patient including but not limited to: bleeding which may require transfusion or reoperation; infection which may require prolonged hospitalization or re-hospitalization and antibiotic therapy; injury to bowel, bladder, uterus (perforation) and major vessels or other surrounding organs which may lead to other procedures; formation of adhesions; need for additional procedures including laparotomy or subsequent procedures secondary to intraoperative injury or  abnormal pathology; thromboembolic phenomenon; incisional problems and other postoperative or anesthesia complications.  Patient was told that the likelihood that her condition and symptoms will be treated effectively with this surgical management was very high; the postoperative expectations were also discussed in detail. The patient also understands the alternative treatment options which were discussed in full. All questions were answered.  - Preop testing ordered. - Instructions reviewed, including NPO after midnight.     Hildred Laser, MD [facility]

## 2022-09-29 NOTE — H&P (View-Only) (Signed)
  GYNECOLOGY PREOPERATIVE HISTORY AND PHYSICAL   Subjective:  Kristi Ibarra is a 47 y.o. G0P0 here for surgical management of dysfunctional uterine bleeding.  She has a history of dysfunctional uterine bleeding over the  past several years. Was last evaluated in 2021.  She reports a trial of Aygestin bur notes that it did not help her much then. Still has a prescription for it so attempts to use as needed when bleeding is very bad.  Reports that her cycles are still irregular, can go from 1-3 months without a cycle, followed by bleeding that can last anywhere from 5-30 days.  Most recent cycle started on 02/27 and ended on 04/04.  Sometimes has to use 2 pads at a time, and changes a pad every 30-45 minutes.  Most recently has not had a cycle so has not trialed the Lysteda that was recommended.  No significant preoperative concerns.  Proposed surgery: Hysteroscopy D&C with endometrial ablation    Pertinent Gynecological History: Contraception: none Last pap smear: 03/2020. Was normal.  Last mammogram: 2019.  Was normal.    Past Medical History:  Diagnosis Date   Fibrocystic breast changes, left 09/03/2017   Kidney stones    Past Surgical History:  Procedure Laterality Date   CHOLECYSTECTOMY N/A 06/03/2018   Procedure: LAPAROSCOPIC CHOLECYSTECTOMY NO INTRAOPERATIVE CHOLANGIOGRAM;  Surgeon: Piscoya, Jose, MD;  Location: [facility] ORS;  Service: General;  Laterality: N/A;   GALLBLADDER SURGERY  06/03/2018    OB History  Gravida Para Term Preterm AB Living  0            SAB IAB Ectopic Multiple Live Births               Family History  Problem Relation Age of Onset   Asthma Mother    Stroke Mother     Social History   Socioeconomic History   Marital status: Married    Spouse name: Not on file   Number of children: Not on file   Years of education: Not on file   Highest education level: Not on file  Occupational History   Not on file  Tobacco Use   Smoking status: Every Day     Packs/day: .5    Types: Cigarettes   Smokeless tobacco: Never  Vaping Use   Vaping Use: Former  Substance and Sexual Activity   Alcohol use: No   Drug use: No   Sexual activity: Yes    Partners: Male    Birth control/protection: None  Other Topics Concern   Not on file  Social History Narrative   Not on file   Social Determinants of Health   Financial Resource Strain: Not on file  Food Insecurity: Not on file  Transportation Needs: Not on file  Physical Activity: Not on file  Stress: Not on file  Social Connections: Not on file  Intimate Partner Violence: Not on file    Current Outpatient Medications on File Prior to Visit  Medication Sig Dispense Refill   ibuprofen (ADVIL,MOTRIN) 600 MG tablet Take 1 tablet (600 mg total) by mouth every 8 (eight) hours as needed for fever, headache, mild pain or moderate pain. 30 tablet 0   omeprazole (PRILOSEC) 20 MG capsule Take 1 capsule (20 mg total) by mouth 2 (two) times daily before a meal. (Patient taking differently: Take 20 mg by mouth daily as needed (reflux symptoms).) 180 capsule 0   ondansetron (ZOFRAN ODT) 4 MG disintegrating tablet Take 1 tablet (4 mg total) by   mouth every 8 (eight) hours as needed for nausea or vomiting. 20 tablet 0   tamsulosin (FLOMAX) 0.4 MG CAPS capsule Take 1 capsule (0.4 mg total) by mouth daily. (Patient taking differently: Take 0.4 mg by mouth daily. PRN) 30 capsule 0   tranexamic acid (LYSTEDA) 650 MG TABS tablet Take 2 tablets (1,300 mg total) by mouth 3 (three) times daily. Take during menses for a maximum of five days 30 tablet 2   No current facility-administered medications on file prior to visit.   Allergies  Allergen Reactions   Erythromycin Hives, Swelling and Rash      Review of Systems Constitutional: No recent fever/chills/sweats Respiratory: No recent cough/bronchitis Cardiovascular: No chest pain Gastrointestinal: No recent nausea/vomiting/diarrhea Genitourinary: No UTI  symptoms Hematologic/lymphatic:No history of coagulopathy or recent blood thinner use    Objective:   Blood pressure 103/72, pulse 86, resp. rate 16, height 5' 3" (1.6 m), weight 171 lb 4.8 oz (77.7 kg). CONSTITUTIONAL: Well-developed, well-nourished female in no acute distress.  HENT:  Normocephalic, atraumatic, External right and left ear normal. Oropharynx is clear and moist EYES: Conjunctivae and EOM are normal. Pupils are equal, round, and reactive to light. No scleral icterus.  NECK: Normal range of motion, supple, no masses SKIN: Skin is warm and dry. No rash noted. Not diaphoretic. No erythema. No pallor. NEUROLOGIC: Alert and oriented to person, place, and time. Normal reflexes, muscle tone coordination. No cranial nerve deficit noted. PSYCHIATRIC: Normal mood and affect. Normal behavior. Normal judgment and thought content. CARDIOVASCULAR: Normal heart rate noted, regular rhythm RESPIRATORY: Effort and breath sounds normal, no problems with respiration noted ABDOMEN: Soft, nontender, nondistended. PELVIC: Deferred MUSCULOSKELETAL: Normal range of motion. No edema and no tenderness. 2+ distal pulses.    Labs: No results found for this or any previous visit (from the past 336 hour(s)).  Pathology:  Endometrial biopsy pending, performed 09/29/2022.    Imaging Studies: ULTRASOUND REPORT   Location: [facility] [place] Date of Service: 09/29/2022        Indications:Abnormal Uterine Bleeding Findings:  The uterus is anteverted and measures 8.87 x 4.16 x 3.66 cm. Echo texture is homogenous without evidence of focal masses.   The Endometrium measures 6.37 mm.   Right Ovary measures 3.25 x 1.97 x 1.54 cm. It is normal in appearance. Left Ovary measures 2.86 x 2.29 x 2.31 cm. It is normal in appearance. ? Small simple cyst vs dom follicle ; 2.07 x 1.46 x 1.55cm Survey of the adnexa demonstrates no adnexal masses. There is no free fluid in the cul de sac.    Impression: 1. Slightly thickened endometrium with no evidence of masses or polyp 2. Dom follicle vs small cyst seen in LT ovary    Recommendations: 1.Clinical correlation with the patient's History and Physical Exam. 2. Follow up with provider    Mikaela E Kroll, RT  Assessment:    1. DUB (dysfunctional uterine bleeding)   2. Preoperative exam for gynecologic surgery    Plan:   - Counseling: Patient desires surgical management with Endometrial Ablation. Workup is now complete. The risks of surgery were discussed in detail with the patient including but not limited to: bleeding which may require transfusion or reoperation; infection which may require prolonged hospitalization or re-hospitalization and antibiotic therapy; injury to bowel, bladder, uterus (perforation) and major vessels or other surrounding organs which may lead to other procedures; formation of adhesions; need for additional procedures including laparotomy or subsequent procedures secondary to intraoperative injury or   abnormal pathology; thromboembolic phenomenon; incisional problems and other postoperative or anesthesia complications.  Patient was told that the likelihood that her condition and symptoms will be treated effectively with this surgical management was very high; the postoperative expectations were also discussed in detail. The patient also understands the alternative treatment options which were discussed in full. All questions were answered.  - Preop testing ordered. - Instructions reviewed, including NPO after midnight.     Ainsley Deakins, MD [facility]  

## 2022-10-01 ENCOUNTER — Other Ambulatory Visit: Payer: Self-pay

## 2022-10-01 ENCOUNTER — Encounter
Admission: RE | Admit: 2022-10-01 | Discharge: 2022-10-01 | Disposition: A | Discharge status: Home / Self Care | Payer: No Typology Code available for payment source | Source: Ambulatory Visit | Attending: Obstetrics and Gynecology | Admitting: Obstetrics and Gynecology

## 2022-10-01 VITALS — Ht 63.0 in | Wt 171.0 lb

## 2022-10-01 DIAGNOSIS — Z01818 Encounter for other preprocedural examination: Secondary | ICD-10-CM

## 2022-10-01 HISTORY — DX: Prediabetes: R73.03

## 2022-10-01 HISTORY — DX: Personal history of urinary calculi: Z87.442

## 2022-10-01 LAB — SURGICAL PATHOLOGY

## 2022-10-01 NOTE — Patient Instructions (Addendum)
Your procedure is scheduled on: Thursday 10/08/22 To find out your arrival time, please call [phone] between 1PM - 3PM on:   Wednesday 10/07/22 Report to the Registration Desk on the 1st floor of the [facility]. Free Valet parking is available.  If your arrival time is 6:00 am, do not arrive before that time as the [facility] entrance doors do not open until 6:00 am.  REMEMBER: Instructions that are not followed completely may result in serious medical risk, up to and including death; or upon the discretion of your surgeon and anesthesiologist your surgery may need to be rescheduled.  Do not eat food or drink any liquids after midnight the night before surgery.  No gum chewing or hard candies.  One week prior to surgery: Stop Anti-inflammatories (NSAIDS) such as Advil, Aleve, Ibuprofen, Motrin, Naproxen, Naprosyn and Aspirin based products such as Excedrin, Goody's Powder, BC Powder. You may however, continue to take Tylenol if needed for pain up until the day of surgery.  Stop ANY OVER THE COUNTER supplements until after surgery.  Continue taking all prescribed medications.   TAKE ONLY THESE MEDICATIONS THE MORNING OF SURGERY WITH A SIP OF WATER:  none  No Alcohol for 24 hours before or after surgery.  No Smoking including e-cigarettes for 24 hours before surgery.  No chewable tobacco products for at least 6 hours before surgery.  No nicotine patches on the day of surgery.  Do not use any "recreational" drugs for at least a week (preferably 2 weeks) before your surgery.  Please be advised that the combination of cocaine and anesthesia may have negative outcomes, up to and including death. If you test positive for cocaine, your surgery will be cancelled.  On the morning of surgery brush your teeth with toothpaste and water, you may rinse your mouth with mouthwash if you wish. Do not swallow any toothpaste or mouthwash.  Use CHG Soap or wipes as directed on instruction  sheet. Shower with your regular soap  Do not wear lotions, powders, or perfumes.   Do not shave body hair from the neck down 48 hours before surgery.  Wear comfortable clothing (specific to your surgery type) to the hospital.  Do not wear jewelry, make-up, hairpins, clips or nail polish.  Contact lenses, hearing aids and dentures may not be worn into surgery.  Do not bring valuables to the hospital. [facility] is not responsible for any missing/lost belongings or valuables.   Notify your doctor if there is any change in your medical condition (cold, fever, infection).  If you are being discharged the day of surgery, you will not be allowed to drive home. You will need a responsible individual to drive you home and stay with you for 24 hours after surgery.   If you are taking public transportation, you will need to have a responsible individual with you.  If you are being admitted to the hospital overnight, leave your suitcase in the car. After surgery it may be brought to your room.  In case of increased patient census, it may be necessary for you, the patient, to continue your postoperative care in the Same Day Surgery department.  After surgery, you can help prevent lung complications by doing breathing exercises.  Take deep breaths and cough every 1-2 hours. Your doctor may order a device called an Incentive Spirometer to help you take deep breaths. When coughing or sneezing, hold a pillow firmly against your incision with both hands. This is called "splinting." Doing  this helps protect your incision. It also decreases belly discomfort.  Surgery Visitation Policy:  Patients undergoing a surgery or procedure may have two family members or support persons with them as long as the person is not COVID-19 positive or experiencing its symptoms.   Inpatient Visitation:    Visiting hours are 7 a.m. to 8 p.m. Up to four visitors are allowed at one time in a patient room. The visitors  may rotate out with other people during the day. One designated support person (adult) may remain overnight.  Please call the Pre-admissions Testing Dept. at [phone] if you have any questions about these instructions.

## 2022-10-06 ENCOUNTER — Encounter: Payer: Self-pay | Admitting: Urgent Care

## 2022-10-06 ENCOUNTER — Encounter
Admission: RE | Admit: 2022-10-06 | Discharge: 2022-10-06 | Disposition: A | Discharge status: Home / Self Care | Payer: No Typology Code available for payment source | Source: Ambulatory Visit | Attending: Obstetrics and Gynecology | Admitting: Obstetrics and Gynecology

## 2022-10-06 DIAGNOSIS — N938 Other specified abnormal uterine and vaginal bleeding: Secondary | ICD-10-CM | POA: Diagnosis not present

## 2022-10-06 DIAGNOSIS — Z01812 Encounter for preprocedural laboratory examination: Secondary | ICD-10-CM | POA: Insufficient documentation

## 2022-10-06 DIAGNOSIS — Z01818 Encounter for other preprocedural examination: Secondary | ICD-10-CM

## 2022-10-06 LAB — CBC
HCT: 44.7 % (ref 36.0–46.0)
Hemoglobin: 14.9 g/dL (ref 12.0–15.0)
MCH: 30.2 pg (ref 26.0–34.0)
MCHC: 33.3 g/dL (ref 30.0–36.0)
MCV: 90.7 fL (ref 80.0–100.0)
Platelets: 318 10*3/uL (ref 150–400)
RBC: 4.93 MIL/uL (ref 3.87–5.11)
RDW: 12.4 % (ref 11.5–15.5)
WBC: 6.3 10*3/uL (ref 4.0–10.5)
nRBC: 0 % (ref 0.0–0.2)

## 2022-10-08 ENCOUNTER — Ambulatory Visit
Admission: RE | Admit: 2022-10-08 | Discharge: 2022-10-08 | Disposition: A | Discharge status: Home / Self Care | Payer: No Typology Code available for payment source | Attending: Obstetrics and Gynecology | Admitting: Obstetrics and Gynecology

## 2022-10-08 ENCOUNTER — Other Ambulatory Visit: Payer: Self-pay

## 2022-10-08 ENCOUNTER — Ambulatory Visit: Payer: No Typology Code available for payment source | Admitting: Anesthesiology

## 2022-10-08 ENCOUNTER — Encounter
Admission: RE | Disposition: A | Discharge status: Home / Self Care | Payer: Self-pay | Source: Home / Self Care | Attending: Obstetrics and Gynecology

## 2022-10-08 ENCOUNTER — Encounter: Payer: Self-pay | Admitting: Obstetrics and Gynecology

## 2022-10-08 DIAGNOSIS — Z79899 Other long term (current) drug therapy: Secondary | ICD-10-CM | POA: Insufficient documentation

## 2022-10-08 DIAGNOSIS — N938 Other specified abnormal uterine and vaginal bleeding: Secondary | ICD-10-CM | POA: Insufficient documentation

## 2022-10-08 DIAGNOSIS — Z01818 Encounter for other preprocedural examination: Secondary | ICD-10-CM

## 2022-10-08 DIAGNOSIS — F17210 Nicotine dependence, cigarettes, uncomplicated: Secondary | ICD-10-CM | POA: Diagnosis not present

## 2022-10-08 DIAGNOSIS — Z9889 Other specified postprocedural states: Secondary | ICD-10-CM

## 2022-10-08 HISTORY — PX: DILITATION & CURRETTAGE/HYSTROSCOPY WITH NOVASURE ABLATION: SHX5568

## 2022-10-08 LAB — POCT PREGNANCY, URINE: Preg Test, Ur: NEGATIVE

## 2022-10-08 SURGERY — DILATATION & CURETTAGE/HYSTEROSCOPY WITH NOVASURE ABLATION
Anesthesia: General | Site: Vagina | Wound class: Clean Contaminated

## 2022-10-08 MED ORDER — FAMOTIDINE 20 MG PO TABS
ORAL_TABLET | ORAL | Status: AC
  Filled 2022-10-08: qty 1

## 2022-10-08 MED ORDER — LIDOCAINE HCL (CARDIAC) PF 100 MG/5ML IV SOSY
PREFILLED_SYRINGE | INTRAVENOUS | Status: DC | PRN
  Administered 2022-10-08: 80 mg via INTRAVENOUS

## 2022-10-08 MED ORDER — LACTATED RINGERS IV SOLN: INTRAVENOUS | Status: DC

## 2022-10-08 MED ORDER — OXYCODONE HCL 5 MG PO TABS: 5.0000 mg | ORAL_TABLET | Freq: Once | ORAL | Status: DC | PRN

## 2022-10-08 MED ORDER — MIDAZOLAM HCL 2 MG/2ML IJ SOLN
INTRAMUSCULAR | Status: DC | PRN
  Administered 2022-10-08: 2 mg via INTRAVENOUS

## 2022-10-08 MED ORDER — ORAL CARE MOUTH RINSE: 15.0000 mL | Freq: Once | OROMUCOSAL | Status: AC

## 2022-10-08 MED ORDER — ONDANSETRON HCL 4 MG/2ML IJ SOLN
INTRAMUSCULAR | Status: AC
  Filled 2022-10-08: qty 2

## 2022-10-08 MED ORDER — FENTANYL CITRATE (PF) 100 MCG/2ML IJ SOLN: 25.0000 ug | INTRAMUSCULAR | Status: DC | PRN

## 2022-10-08 MED ORDER — GABAPENTIN 300 MG PO CAPS
300.0000 mg | ORAL_CAPSULE | ORAL | Status: AC
  Administered 2022-10-08: 300 mg via ORAL

## 2022-10-08 MED ORDER — ACETAMINOPHEN 500 MG PO TABS
1000.0000 mg | ORAL_TABLET | ORAL | Status: AC
  Administered 2022-10-08: 1000 mg via ORAL

## 2022-10-08 MED ORDER — CHLORHEXIDINE GLUCONATE 0.12 % MT SOLN
OROMUCOSAL | Status: AC
  Filled 2022-10-08: qty 15

## 2022-10-08 MED ORDER — PROPOFOL 10 MG/ML IV BOLUS
INTRAVENOUS | Status: AC
  Filled 2022-10-08: qty 20

## 2022-10-08 MED ORDER — CELECOXIB 200 MG PO CAPS
400.0000 mg | ORAL_CAPSULE | ORAL | Status: AC
  Administered 2022-10-08: 400 mg via ORAL

## 2022-10-08 MED ORDER — KETOROLAC TROMETHAMINE 30 MG/ML IJ SOLN
INTRAMUSCULAR | Status: DC | PRN
  Administered 2022-10-08: 30 mg via INTRAVENOUS

## 2022-10-08 MED ORDER — GABAPENTIN 300 MG PO CAPS
ORAL_CAPSULE | ORAL | Status: AC
  Filled 2022-10-08: qty 1

## 2022-10-08 MED ORDER — KETOROLAC TROMETHAMINE 30 MG/ML IJ SOLN
INTRAMUSCULAR | Status: AC
  Filled 2022-10-08: qty 1

## 2022-10-08 MED ORDER — DROPERIDOL 2.5 MG/ML IJ SOLN
INTRAMUSCULAR | Status: AC
  Filled 2022-10-08: qty 2

## 2022-10-08 MED ORDER — FAMOTIDINE 20 MG PO TABS
20.0000 mg | ORAL_TABLET | Freq: Once | ORAL | Status: AC
  Administered 2022-10-08: 20 mg via ORAL

## 2022-10-08 MED ORDER — DEXAMETHASONE SODIUM PHOSPHATE 10 MG/ML IJ SOLN
INTRAMUSCULAR | Status: AC
  Filled 2022-10-08: qty 1

## 2022-10-08 MED ORDER — FENTANYL CITRATE (PF) 100 MCG/2ML IJ SOLN
INTRAMUSCULAR | Status: DC | PRN
  Administered 2022-10-08 (×4): 25 ug via INTRAVENOUS

## 2022-10-08 MED ORDER — PROPOFOL 10 MG/ML IV BOLUS
INTRAVENOUS | Status: DC | PRN
  Administered 2022-10-08: 130 mg via INTRAVENOUS

## 2022-10-08 MED ORDER — OXYCODONE HCL 5 MG/5ML PO SOLN: 5.0000 mg | Freq: Once | ORAL | Status: DC | PRN

## 2022-10-08 MED ORDER — DROPERIDOL 2.5 MG/ML IJ SOLN
0.6250 mg | Freq: Once | INTRAMUSCULAR | Status: AC
  Administered 2022-10-08: 0.625 mg via INTRAVENOUS

## 2022-10-08 MED ORDER — CELECOXIB 200 MG PO CAPS
ORAL_CAPSULE | ORAL | Status: AC
  Filled 2022-10-08: qty 2

## 2022-10-08 MED ORDER — ACETAMINOPHEN 500 MG PO TABS
ORAL_TABLET | ORAL | Status: AC
  Filled 2022-10-08: qty 2

## 2022-10-08 MED ORDER — ONDANSETRON HCL 4 MG/2ML IJ SOLN
INTRAMUSCULAR | Status: DC | PRN
  Administered 2022-10-08: 4 mg via INTRAVENOUS

## 2022-10-08 MED ORDER — LACTATED RINGERS IR SOLN
Status: DC | PRN
  Administered 2022-10-08: 300 mL

## 2022-10-08 MED ORDER — FENTANYL CITRATE (PF) 100 MCG/2ML IJ SOLN
INTRAMUSCULAR | Status: AC
  Filled 2022-10-08: qty 2

## 2022-10-08 MED ORDER — IBUPROFEN 600 MG PO TABS: 600.0000 mg | ORAL_TABLET | Freq: Four times a day (QID) | ORAL | 0 refills | Status: AC | PRN

## 2022-10-08 MED ORDER — CHLORHEXIDINE GLUCONATE 0.12 % MT SOLN
15.0000 mL | Freq: Once | OROMUCOSAL | Status: AC
  Administered 2022-10-08: 15 mL via OROMUCOSAL

## 2022-10-08 MED ORDER — LIDOCAINE HCL (PF) 2 % IJ SOLN
INTRAMUSCULAR | Status: AC
  Filled 2022-10-08: qty 5

## 2022-10-08 MED ORDER — MIDAZOLAM HCL 2 MG/2ML IJ SOLN
INTRAMUSCULAR | Status: AC
  Filled 2022-10-08: qty 2

## 2022-10-08 MED ORDER — 0.9 % SODIUM CHLORIDE (POUR BTL) OPTIME
TOPICAL | Status: DC | PRN
  Administered 2022-10-08: 500 mL

## 2022-10-08 MED ORDER — DEXAMETHASONE SODIUM PHOSPHATE 10 MG/ML IJ SOLN
INTRAMUSCULAR | Status: DC | PRN
  Administered 2022-10-08: 10 mg via INTRAVENOUS

## 2022-10-08 SURGICAL SUPPLY — 15 items
ABLATOR SURESOUND NOVASURE (ABLATOR) IMPLANT
GLOVE BIO SURGEON STRL SZ 6.5 (GLOVE) ×2 IMPLANT
GLOVE INDICATOR 7.0 STRL GRN (GLOVE) ×2 IMPLANT
GOWN STRL REUS W/ TWL LRG LVL3 (GOWN DISPOSABLE) ×4 IMPLANT
GOWN STRL REUS W/TWL LRG LVL3 (GOWN DISPOSABLE) ×3
IV LACTATED RINGER IRRG 3000ML (IV SOLUTION) ×1
IV LR IRRIG 3000ML ARTHROMATIC (IV SOLUTION) IMPLANT
KIT TURNOVER CYSTO (KITS) ×2 IMPLANT
LABEL OR SOLS (LABEL) ×2 IMPLANT
NS IRRIG 500ML POUR BTL (IV SOLUTION) ×2 IMPLANT
PACK DNC HYST (MISCELLANEOUS) ×2 IMPLANT
PAD PREP OB/GYN DISP 24X41 (PERSONAL CARE ITEMS) ×2 IMPLANT
SCRUB CHG 4% DYNA-HEX 4OZ (MISCELLANEOUS) ×2 IMPLANT
SEAL ROD LENS SCOPE MYOSURE (ABLATOR) ×2 IMPLANT
TOWEL OR 17X26 4PK STRL BLUE (TOWEL DISPOSABLE) ×2 IMPLANT

## 2022-10-08 NOTE — Op Note (Signed)
Procedure(s): DILATATION & CURETTAGE/HYSTEROSCOPY WITH ENDOMETRIAL ABLATION Procedure Note  Kristi Ibarra female 47 y.o. 10/08/2022  Indications: The patient is a 47 y.o. G0P0 female with dysfunctional uterine bleeding (likely perimenopausal).   Pre-operative Diagnosis: Dysfunctional uterine bleeding  Post-operative Diagnosis: Same  Surgeon: Hildred Laser, MD  Assistants:  None.   Anesthesia: General endotracheal anesthesia  Findings: Uterus sounded to 8 cm.  Cervical length 3 cm.  Weakly proliferative endometrium.  Tubal ostia were visualized bilaterally.  No intrauterine masses.  Total ablation time 52 secs.   Adequate charring of endometrial tissue post ablation.  No perforations noted.   Procedure Details: The patient was seen in the Holding Room. The risks, benefits, complications, treatment options, and expected outcomes were discussed with the patient.  The patient concurred with the proposed plan, giving informed consent.  The site of surgery properly noted/marked. The patient was taken to the Operating Room, identified as [name] and the procedure verified as Procedure(s) (LRB): DILATATION & CURETTAGE/HYSTEROSCOPY WITH ENDOMETRIAL ABLATION (N/A). A Time Out was held and the above information confirmed.  The patient was then placed under general anesthesia without difficulty.  She was then prepped and draped in the normal sterile fashion, and placed in the dorsal lithotomy position.  A time out was performed.  An exam under anesthesia was performed with the findings noted above.  Straight catheterization was performed. A sterile speculum was inserted into vagina. A single-tooth tenaculum was used to grasp the anterior lip of the cervix. Cervical dilation was performed. A 5 mm hysteroscope was introduced into the uterus under direct visualization. The cavity was allowed to fill, and then the entire cavity was explored with the findings described above. The hysteroscope was  removed, and a sharp curette was then passed into the uterus and endometrial sampling was collected for pathology.   Next the NovaSure instrument was primed per instructions. The instrument was then placed into the endometrial canal and activated.  Total ablation time was 52 sec.  The NovaSure instrument was then removed from the uterine cavity.  The hysteroscope was then re-introduced for final survey, with adequate charring of the endometrium noted.  The hysteroscope was removed from the patient's uterine cavity. The tenaculum was removed and excellent hemostasis was noted. The speculum was removed from the vagina.   All instrument and sponge counts were correct at the end of the procedure x 2.  The patient tolerated the procedure well.  She was awakened and taken to the PACU in stable condition.   Findings:  Estimated Blood Loss:  5 ml      Drains: straight catheterization with 20 cc at start of procedure.          Total IV Fluids:  300 ml  Fluid Deficit: 100 ml  Specimens:  Endometrial curettings         Implants: None         Complications:  None; patient tolerated the procedure well.         Disposition: PACU - hemodynamically stable.         Condition: stable   Hildred Laser, MD [facility] at [facility]

## 2022-10-08 NOTE — Anesthesia Preprocedure Evaluation (Signed)
Anesthesia Evaluation  Patient identified by MRN, date of birth, ID band Patient awake    Reviewed: Allergy & Precautions, NPO status , Patient's Chart, lab work & pertinent test results  History of Anesthesia Complications Negative for: history of anesthetic complications  Airway Mallampati: II  TM Distance: >3 FB Neck ROM: full    Dental no notable dental hx.    Pulmonary neg pulmonary ROS, Patient abstained from smoking., former smoker   Pulmonary exam normal        Cardiovascular negative cardio ROS Normal cardiovascular exam     Neuro/Psych negative neurological ROS  negative psych ROS   GI/Hepatic negative GI ROS, Neg liver ROS,,,  Endo/Other  negative endocrine ROS    Renal/GU      Musculoskeletal   Abdominal   Peds  Hematology negative hematology ROS (+)   Anesthesia Other Findings Past Medical History: 09/03/2017: Fibrocystic breast changes, left No date: History of kidney stones No date: Kidney stones No date: Pre-diabetes  Past Surgical History: 06/03/2018: CHOLECYSTECTOMY; N/A     Comment:  Procedure: LAPAROSCOPIC CHOLECYSTECTOMY NO               INTRAOPERATIVE CHOLANGIOGRAM;  Surgeon: Henrene Dodge,               MD;  Location: [facility] ORS;  Service: General;  Laterality:               N/A;  BMI    Body Mass Index: 30.30 kg/m      Reproductive/Obstetrics negative OB ROS                             Anesthesia Physical Anesthesia Plan  ASA: 2  Anesthesia Plan: General ETT   Post-op Pain Management: Tylenol PO (pre-op)*, Gabapentin PO (pre-op)* and Celebrex PO (pre-op)*   Induction: Intravenous  PONV Risk Score and Plan: 4 or greater and Ondansetron, Dexamethasone, Midazolam and Treatment may vary due to age or medical condition  Airway Management Planned: Oral ETT  Additional Equipment:   Intra-op Plan:   Post-operative Plan: Extubation in OR  Informed  Consent: I have reviewed the patients History and Physical, chart, labs and discussed the procedure including the risks, benefits and alternatives for the proposed anesthesia with the patient or authorized representative who has indicated his/her understanding and acceptance.     Dental Advisory Given  Plan Discussed with: Anesthesiologist, CRNA and Surgeon  Anesthesia Plan Comments: (Patient consented for risks of anesthesia including but not limited to:  - adverse reactions to medications - damage to eyes, teeth, lips or other oral mucosa - nerve damage due to positioning  - sore throat or hoarseness - Damage to heart, brain, nerves, lungs, other parts of body or loss of life  Patient voiced understanding.)        Anesthesia Quick Evaluation

## 2022-10-08 NOTE — Discharge Instructions (Signed)

## 2022-10-08 NOTE — Anesthesia Procedure Notes (Signed)
Procedure Name: LMA Insertion Date/Time: 10/08/2022 12:17 PM  Performed by: Omer Jack, CRNAPre-anesthesia Checklist: Patient identified, Patient being monitored, Timeout performed, Emergency Drugs available and Suction available Patient Re-evaluated:Patient Re-evaluated prior to induction Oxygen Delivery Method: Circle system utilized Preoxygenation: Pre-oxygenation with 100% oxygen Induction Type: IV induction Ventilation: Mask ventilation without difficulty LMA: LMA inserted LMA Size: 4.0 Tube type: Oral Number of attempts: 1 Placement Confirmation: positive ETCO2 and breath sounds checked- equal and bilateral Tube secured with: Tape Dental Injury: Teeth and Oropharynx as per pre-operative assessment

## 2022-10-08 NOTE — Transfer of Care (Signed)
Immediate Anesthesia Transfer of Care Note  Patient: Kristi Ibarra  Procedure(s) Performed: DILATATION & CURETTAGE/HYSTEROSCOPY WITH ENDOMETRIAL ABLATION (Vagina )  Patient Location: PACU  Anesthesia Type:General  Level of Consciousness: drowsy and patient cooperative  Airway & Oxygen Therapy: Patient Spontanous Breathing and Patient connected to face mask oxygen  Post-op Assessment: Report given to RN and Post -op Vital signs reviewed and stable  Post vital signs: Reviewed and stable  Last Vitals:  Vitals Value Taken Time  BP 119/90 10/08/22 1315  Temp    Pulse 78 10/08/22 1321  Resp 9 10/08/22 1321  SpO2 99 % 10/08/22 1321  Vitals shown include unvalidated device data.  Last Pain:  Vitals:   10/08/22 1315  TempSrc:   PainSc: Asleep         Complications: No notable events documented.

## 2022-10-08 NOTE — Anesthesia Postprocedure Evaluation (Signed)
Anesthesia Post Note  Patient: [name]  Procedure(s) Performed: DILATATION & CURETTAGE/HYSTEROSCOPY WITH ENDOMETRIAL ABLATION (Vagina )  Patient location during evaluation: PACU Anesthesia Type: General Level of consciousness: awake and alert Pain management: pain level controlled Vital Signs Assessment: post-procedure vital signs reviewed and stable Respiratory status: spontaneous breathing, nonlabored ventilation, respiratory function stable and patient connected to nasal cannula oxygen Cardiovascular status: blood pressure returned to baseline and stable Postop Assessment: no apparent nausea or vomiting Anesthetic complications: no   No notable events documented.   Last Vitals:  Vitals:   10/08/22 1345 10/08/22 1400  BP: (!) 125/93 117/81  Pulse:  83  Resp: 10 15  Temp:  (!) 36.3 C  SpO2:  91%    Last Pain:  Vitals:   10/08/22 1315  TempSrc:   PainSc: Asleep                 Kristi Ibarra

## 2022-10-08 NOTE — Interval H&P Note (Signed)
History and Physical Interval Note:  10/08/2022 11:41 AM  Kristi Ibarra  has presented today for surgery, with the diagnosis of Dysfunctional Uterine Bleeding.  The various methods of treatment have been discussed with the patient and family. After consideration of risks, benefits and other options for treatment, the patient has consented to  Procedure(s): DILATATION & CURETTAGE/HYSTEROSCOPY WITH ENDOMETRIAL ABLATION (N/A) as a surgical intervention.  The patient's history has been reviewed, patient examined, no change in status, stable for surgery.  I have reviewed the patient's chart and labs.  Questions were answered to the patient's satisfaction.     Hildred Laser, MD [facility] at [facility]

## 2022-10-09 ENCOUNTER — Encounter: Payer: Self-pay | Admitting: Obstetrics and Gynecology

## 2022-10-16 ENCOUNTER — Encounter: Payer: Self-pay | Admitting: Obstetrics and Gynecology

## 2022-10-16 ENCOUNTER — Ambulatory Visit (INDEPENDENT_AMBULATORY_CARE_PROVIDER_SITE_OTHER): Payer: No Typology Code available for payment source | Admitting: Obstetrics and Gynecology

## 2022-10-16 VITALS — BP 116/83 | HR 94 | Ht 63.0 in | Wt 169.0 lb

## 2022-10-16 DIAGNOSIS — Z4889 Encounter for other specified surgical aftercare: Secondary | ICD-10-CM

## 2022-10-16 DIAGNOSIS — Z1231 Encounter for screening mammogram for malignant neoplasm of breast: Secondary | ICD-10-CM

## 2022-10-16 DIAGNOSIS — Z9889 Other specified postprocedural states: Secondary | ICD-10-CM

## 2022-10-16 NOTE — Progress Notes (Unsigned)
    OBSTETRICS/GYNECOLOGY POST-OPERATIVE CLINIC VISIT  Subjective:     Kristi Ibarra is a 47 y.o. female who presents to the clinic 1 weeks status post   Hysteroscopy D&C with endometrial ablation  for  dysfunctional uterine bleeding . Eating a regular diet without difficulty. Bowel movements are normal. The patient is not having any pain.  The following portions of the patient's history were reviewed and updated as appropriate: allergies, current medications, past family history, past medical history, past social history, past surgical history, and problem list.  Review of Systems Pertinent items are noted in HPI.   Objective:   There were no vitals taken for this visit. There is no height or weight on file to calculate BMI.  General:  alert and no distress  Abdomen: soft, bowel sounds active, non-tender  Incision:   healing well, no drainage, no erythema, no hernia, no seroma, no swelling, no dehiscence, incision well approximated    Pathology:    Assessment:   Patient s/p 10/08/22 (surgery)  Doing well postoperatively.   Plan:   1. Continue any current medications as instructed by provider. 2. Wound care discussed. 3. Operative findings again reviewed. Pathology report discussed. 4. Activity restrictions: {restrictions:13723} 5. Anticipated return to work: {work return:14002}. 6. Follow up: {4-09:81191} {time; units:18646} for ***    Loney Laurence, CMA [facility]

## 2022-10-17 ENCOUNTER — Encounter: Payer: Self-pay | Admitting: Obstetrics and Gynecology
